# Patient Record
Sex: Female | Born: 1945 | Race: White | Hispanic: No | Marital: Single | Smoking: Never smoker
Health system: Southern US, Community
[De-identification: ages and names within clinical notes are randomized; demographics above are authoritative.]

## PROBLEM LIST (undated history)

## (undated) DIAGNOSIS — E7800 Pure hypercholesterolemia, unspecified: Secondary | ICD-10-CM

## (undated) DIAGNOSIS — E229 Hyperfunction of pituitary gland, unspecified: Secondary | ICD-10-CM

## (undated) DIAGNOSIS — R319 Hematuria, unspecified: Secondary | ICD-10-CM

## (undated) DIAGNOSIS — IMO0002 Reserved for concepts with insufficient information to code with codable children: Secondary | ICD-10-CM

## (undated) DIAGNOSIS — R87619 Unspecified abnormal cytological findings in specimens from cervix uteri: Secondary | ICD-10-CM

## (undated) DIAGNOSIS — R7989 Other specified abnormal findings of blood chemistry: Secondary | ICD-10-CM

## (undated) HISTORY — DX: Unspecified abnormal cytological findings in specimens from cervix uteri: R87.619

## (undated) HISTORY — PX: BREAST BIOPSY: SHX20

## (undated) HISTORY — PX: APPENDECTOMY: SHX54

## (undated) HISTORY — DX: Pure hypercholesterolemia, unspecified: E78.00

## (undated) HISTORY — PX: GYNECOLOGIC CRYOSURGERY: SHX857

## (undated) HISTORY — PX: CHOLECYSTECTOMY: SHX55

## (undated) HISTORY — DX: Hyperfunction of pituitary gland, unspecified: E22.9

## (undated) HISTORY — DX: Other specified abnormal findings of blood chemistry: R79.89

## (undated) HISTORY — DX: Reserved for concepts with insufficient information to code with codable children: IMO0002

## (undated) HISTORY — DX: Hematuria, unspecified: R31.9

## (undated) HISTORY — PX: STRABISMUS SURGERY: SHX218

---

## 1991-10-05 HISTORY — PX: TUBAL LIGATION: SHX77

## 1996-10-04 HISTORY — PX: VULVAR LESION REMOVAL: SHX5391

## 1999-07-06 ENCOUNTER — Other Ambulatory Visit: Admission: RE | Admit: 1999-07-06 | Discharge: 1999-07-06 | Payer: Self-pay | Admitting: Obstetrics and Gynecology

## 2000-08-22 ENCOUNTER — Other Ambulatory Visit: Admission: RE | Admit: 2000-08-22 | Discharge: 2000-08-22 | Payer: Self-pay | Admitting: Obstetrics and Gynecology

## 2001-07-25 ENCOUNTER — Other Ambulatory Visit: Admission: RE | Admit: 2001-07-25 | Discharge: 2001-07-25 | Payer: Self-pay | Admitting: Obstetrics and Gynecology

## 2001-08-22 ENCOUNTER — Encounter: Payer: Self-pay | Admitting: Obstetrics and Gynecology

## 2001-08-22 ENCOUNTER — Ambulatory Visit (HOSPITAL_COMMUNITY): Admission: RE | Admit: 2001-08-22 | Discharge: 2001-08-22 | Payer: Self-pay | Admitting: Obstetrics and Gynecology

## 2002-07-09 ENCOUNTER — Encounter (INDEPENDENT_AMBULATORY_CARE_PROVIDER_SITE_OTHER): Payer: Self-pay | Admitting: Specialist

## 2002-07-09 ENCOUNTER — Ambulatory Visit (HOSPITAL_COMMUNITY): Admission: RE | Admit: 2002-07-09 | Discharge: 2002-07-09 | Payer: Self-pay | Admitting: *Deleted

## 2002-07-31 ENCOUNTER — Other Ambulatory Visit: Admission: RE | Admit: 2002-07-31 | Discharge: 2002-07-31 | Payer: Self-pay | Admitting: Obstetrics and Gynecology

## 2002-08-28 ENCOUNTER — Ambulatory Visit (HOSPITAL_COMMUNITY): Admission: RE | Admit: 2002-08-28 | Discharge: 2002-08-28 | Payer: Self-pay | Admitting: Obstetrics and Gynecology

## 2002-08-28 ENCOUNTER — Encounter: Payer: Self-pay | Admitting: Obstetrics and Gynecology

## 2003-09-20 ENCOUNTER — Ambulatory Visit (HOSPITAL_COMMUNITY): Admission: RE | Admit: 2003-09-20 | Discharge: 2003-09-20 | Payer: Self-pay | Admitting: Obstetrics and Gynecology

## 2003-10-11 ENCOUNTER — Encounter: Admission: RE | Admit: 2003-10-11 | Discharge: 2003-10-11 | Payer: Self-pay | Admitting: Obstetrics and Gynecology

## 2003-11-05 ENCOUNTER — Other Ambulatory Visit: Admission: RE | Admit: 2003-11-05 | Discharge: 2003-11-05 | Payer: Self-pay | Admitting: Obstetrics and Gynecology

## 2004-12-04 ENCOUNTER — Encounter: Admission: RE | Admit: 2004-12-04 | Discharge: 2004-12-04 | Payer: Self-pay | Admitting: Obstetrics and Gynecology

## 2004-12-07 ENCOUNTER — Other Ambulatory Visit: Admission: RE | Admit: 2004-12-07 | Discharge: 2004-12-07 | Payer: Self-pay | Admitting: Obstetrics and Gynecology

## 2005-12-23 ENCOUNTER — Encounter: Admission: RE | Admit: 2005-12-23 | Discharge: 2005-12-23 | Payer: Self-pay | Admitting: Obstetrics and Gynecology

## 2006-02-02 ENCOUNTER — Other Ambulatory Visit: Admission: RE | Admit: 2006-02-02 | Discharge: 2006-02-02 | Payer: Self-pay | Admitting: Obstetrics and Gynecology

## 2007-01-11 ENCOUNTER — Encounter: Admission: RE | Admit: 2007-01-11 | Discharge: 2007-01-11 | Payer: Self-pay | Admitting: Obstetrics and Gynecology

## 2007-02-14 ENCOUNTER — Other Ambulatory Visit: Admission: RE | Admit: 2007-02-14 | Discharge: 2007-02-14 | Payer: Self-pay | Admitting: Obstetrics and Gynecology

## 2008-02-05 ENCOUNTER — Encounter: Admission: RE | Admit: 2008-02-05 | Discharge: 2008-02-05 | Payer: Self-pay | Admitting: Obstetrics and Gynecology

## 2008-02-23 ENCOUNTER — Other Ambulatory Visit: Admission: RE | Admit: 2008-02-23 | Discharge: 2008-02-23 | Payer: Self-pay | Admitting: Obstetrics and Gynecology

## 2009-02-11 ENCOUNTER — Encounter: Admission: RE | Admit: 2009-02-11 | Discharge: 2009-02-11 | Payer: Self-pay | Admitting: Obstetrics and Gynecology

## 2010-02-18 ENCOUNTER — Encounter: Admission: RE | Admit: 2010-02-18 | Discharge: 2010-02-18 | Payer: Self-pay | Admitting: Obstetrics and Gynecology

## 2010-02-24 ENCOUNTER — Encounter: Admission: RE | Admit: 2010-02-24 | Discharge: 2010-02-24 | Payer: Self-pay | Admitting: Obstetrics and Gynecology

## 2010-07-01 ENCOUNTER — Emergency Department (HOSPITAL_COMMUNITY): Admission: EM | Admit: 2010-07-01 | Discharge: 2010-07-01 | Payer: Self-pay | Admitting: Emergency Medicine

## 2010-10-04 HISTORY — PX: NASAL HEMORRHAGE CONTROL: SHX287

## 2010-12-17 LAB — POCT I-STAT, CHEM 8
Chloride: 104 mEq/L (ref 96–112)
Glucose, Bld: 110 mg/dL — ABNORMAL HIGH (ref 70–99)
HCT: 36 % (ref 36.0–46.0)
Hemoglobin: 12.2 g/dL (ref 12.0–15.0)
Potassium: 4.2 mEq/L (ref 3.5–5.1)

## 2011-02-11 ENCOUNTER — Other Ambulatory Visit: Payer: Self-pay | Admitting: Obstetrics and Gynecology

## 2011-02-11 DIAGNOSIS — Z1231 Encounter for screening mammogram for malignant neoplasm of breast: Secondary | ICD-10-CM

## 2011-02-23 ENCOUNTER — Ambulatory Visit: Payer: Self-pay

## 2011-02-24 ENCOUNTER — Ambulatory Visit: Payer: Self-pay

## 2011-03-09 ENCOUNTER — Ambulatory Visit: Payer: Self-pay

## 2011-03-16 ENCOUNTER — Ambulatory Visit
Admission: RE | Admit: 2011-03-16 | Discharge: 2011-03-16 | Disposition: A | Discharge status: Home / Self Care | Payer: Medicare Other | Source: Ambulatory Visit | Attending: Obstetrics and Gynecology | Admitting: Obstetrics and Gynecology

## 2011-03-16 DIAGNOSIS — Z1231 Encounter for screening mammogram for malignant neoplasm of breast: Secondary | ICD-10-CM

## 2011-03-17 ENCOUNTER — Other Ambulatory Visit: Payer: Self-pay | Admitting: Obstetrics and Gynecology

## 2011-03-17 DIAGNOSIS — R928 Other abnormal and inconclusive findings on diagnostic imaging of breast: Secondary | ICD-10-CM

## 2011-04-05 ENCOUNTER — Other Ambulatory Visit: Payer: Medicare Other

## 2011-04-12 ENCOUNTER — Ambulatory Visit
Admission: RE | Admit: 2011-04-12 | Discharge: 2011-04-12 | Disposition: A | Discharge status: Home / Self Care | Payer: Medicare Other | Source: Ambulatory Visit | Attending: Obstetrics and Gynecology | Admitting: Obstetrics and Gynecology

## 2011-04-12 DIAGNOSIS — R928 Other abnormal and inconclusive findings on diagnostic imaging of breast: Secondary | ICD-10-CM

## 2011-10-27 DIAGNOSIS — Z23 Encounter for immunization: Secondary | ICD-10-CM | POA: Diagnosis not present

## 2012-01-20 DIAGNOSIS — E7800 Pure hypercholesterolemia, unspecified: Secondary | ICD-10-CM | POA: Diagnosis not present

## 2012-02-04 DIAGNOSIS — D239 Other benign neoplasm of skin, unspecified: Secondary | ICD-10-CM | POA: Diagnosis not present

## 2012-02-04 DIAGNOSIS — L821 Other seborrheic keratosis: Secondary | ICD-10-CM | POA: Diagnosis not present

## 2012-02-04 DIAGNOSIS — L819 Disorder of pigmentation, unspecified: Secondary | ICD-10-CM | POA: Diagnosis not present

## 2012-02-04 DIAGNOSIS — D1801 Hemangioma of skin and subcutaneous tissue: Secondary | ICD-10-CM | POA: Diagnosis not present

## 2012-04-26 ENCOUNTER — Other Ambulatory Visit: Payer: Self-pay | Admitting: Obstetrics and Gynecology

## 2012-04-26 DIAGNOSIS — Z1231 Encounter for screening mammogram for malignant neoplasm of breast: Secondary | ICD-10-CM

## 2012-05-09 ENCOUNTER — Ambulatory Visit
Admission: RE | Admit: 2012-05-09 | Discharge: 2012-05-09 | Disposition: A | Discharge status: Home / Self Care | Payer: Medicare Other | Source: Ambulatory Visit | Attending: Obstetrics and Gynecology | Admitting: Obstetrics and Gynecology

## 2012-05-09 DIAGNOSIS — Z1231 Encounter for screening mammogram for malignant neoplasm of breast: Secondary | ICD-10-CM

## 2012-05-24 DIAGNOSIS — Z124 Encounter for screening for malignant neoplasm of cervix: Secondary | ICD-10-CM | POA: Diagnosis not present

## 2012-05-24 DIAGNOSIS — Z01419 Encounter for gynecological examination (general) (routine) without abnormal findings: Secondary | ICD-10-CM | POA: Diagnosis not present

## 2012-07-21 DIAGNOSIS — Z862 Personal history of diseases of the blood and blood-forming organs and certain disorders involving the immune mechanism: Secondary | ICD-10-CM | POA: Diagnosis not present

## 2012-07-21 DIAGNOSIS — Z23 Encounter for immunization: Secondary | ICD-10-CM | POA: Diagnosis not present

## 2012-07-21 DIAGNOSIS — M949 Disorder of cartilage, unspecified: Secondary | ICD-10-CM | POA: Diagnosis not present

## 2012-07-21 DIAGNOSIS — E559 Vitamin D deficiency, unspecified: Secondary | ICD-10-CM | POA: Diagnosis not present

## 2012-07-21 DIAGNOSIS — E782 Mixed hyperlipidemia: Secondary | ICD-10-CM | POA: Diagnosis not present

## 2012-07-21 DIAGNOSIS — M899 Disorder of bone, unspecified: Secondary | ICD-10-CM | POA: Diagnosis not present

## 2012-07-21 DIAGNOSIS — Z0000 Encounter for general adult medical examination without abnormal findings: Secondary | ICD-10-CM | POA: Diagnosis not present

## 2012-07-21 DIAGNOSIS — Z8639 Personal history of other endocrine, nutritional and metabolic disease: Secondary | ICD-10-CM | POA: Diagnosis not present

## 2012-07-24 DIAGNOSIS — M949 Disorder of cartilage, unspecified: Secondary | ICD-10-CM | POA: Diagnosis not present

## 2012-07-24 DIAGNOSIS — M899 Disorder of bone, unspecified: Secondary | ICD-10-CM | POA: Diagnosis not present

## 2012-09-08 DIAGNOSIS — H524 Presbyopia: Secondary | ICD-10-CM | POA: Diagnosis not present

## 2012-09-08 DIAGNOSIS — H5200 Hypermetropia, unspecified eye: Secondary | ICD-10-CM | POA: Diagnosis not present

## 2012-09-08 DIAGNOSIS — H53009 Unspecified amblyopia, unspecified eye: Secondary | ICD-10-CM | POA: Diagnosis not present

## 2012-09-08 DIAGNOSIS — H25019 Cortical age-related cataract, unspecified eye: Secondary | ICD-10-CM | POA: Diagnosis not present

## 2012-12-21 ENCOUNTER — Telehealth: Payer: Self-pay | Admitting: *Deleted

## 2012-12-21 NOTE — Telephone Encounter (Signed)
CHART IN CABINET FOR YOU TO SIGN AND DOCUMENT FOR PRIOR AUTH  FOR PROMETRIUM. WAS APPROVED LAST YEAR. NEED RE APPROVAL FOR ANOTHER YEAR. SEE DOCUMENTATION AND LETTER FROM LAST YEAR IN CHART WHERE STICKY NOTE IS. GIVE BACK TO ME AND I WILL FAX. SUE

## 2013-01-02 ENCOUNTER — Telehealth: Payer: Self-pay | Admitting: Obstetrics and Gynecology

## 2013-01-02 NOTE — Telephone Encounter (Signed)
Pt calling to check if pharmacy has sent request for a refill.

## 2013-01-02 NOTE — Telephone Encounter (Signed)
Spoke with pt. Regarding a Medicare denial for prometrium, pt. Says she's not having any problems with using  brand name and would like to continue. She is completely out and will pick up refill and pay out of pocket . T/C to [facility] regarding the denial letter in chart dated 12/23/2012, Coverage is available for The following drugs on the formulary   Progesterone 100mg  and 200mg . Reason for denial product is not an FDA-approved indication or a medically-accepted indication per CMS-recognized compendia. If you wish to appeal An address is in the chart. Please advise chart is in your cabinet  Thanks

## 2013-01-03 NOTE — Telephone Encounter (Signed)
It would be fine for her to use progesterone 100 mg po qhs - really no reason to insist on name brand, especially if it will save her $$.  Whichever she wants is fine, ok to refill through August 2014, when she is due for her AnEx.

## 2013-01-03 NOTE — Telephone Encounter (Signed)
01/03/13 LMTCB cm

## 2013-01-04 NOTE — Telephone Encounter (Signed)
Patient says she has enough rf's to last her until AEX in august; advised pt per Dr. Tresa Res that the brand name was not necessary, pt is agreeable.

## 2013-02-14 DIAGNOSIS — M2550 Pain in unspecified joint: Secondary | ICD-10-CM | POA: Diagnosis not present

## 2013-02-14 DIAGNOSIS — N951 Menopausal and female climacteric states: Secondary | ICD-10-CM | POA: Diagnosis not present

## 2013-02-14 DIAGNOSIS — J309 Allergic rhinitis, unspecified: Secondary | ICD-10-CM | POA: Diagnosis not present

## 2013-02-14 DIAGNOSIS — D699 Hemorrhagic condition, unspecified: Secondary | ICD-10-CM | POA: Diagnosis not present

## 2013-02-14 DIAGNOSIS — Z713 Dietary counseling and surveillance: Secondary | ICD-10-CM | POA: Diagnosis not present

## 2013-02-27 DIAGNOSIS — K5730 Diverticulosis of large intestine without perforation or abscess without bleeding: Secondary | ICD-10-CM | POA: Diagnosis not present

## 2013-02-27 DIAGNOSIS — K648 Other hemorrhoids: Secondary | ICD-10-CM | POA: Diagnosis not present

## 2013-02-27 DIAGNOSIS — Z86010 Personal history of colonic polyps: Secondary | ICD-10-CM | POA: Diagnosis not present

## 2013-02-27 DIAGNOSIS — Z09 Encounter for follow-up examination after completed treatment for conditions other than malignant neoplasm: Secondary | ICD-10-CM | POA: Diagnosis not present

## 2013-04-09 ENCOUNTER — Other Ambulatory Visit: Payer: Self-pay

## 2013-04-09 DIAGNOSIS — Z1231 Encounter for screening mammogram for malignant neoplasm of breast: Secondary | ICD-10-CM

## 2013-05-10 ENCOUNTER — Ambulatory Visit
Admission: RE | Admit: 2013-05-10 | Discharge: 2013-05-10 | Disposition: A | Discharge status: Home / Self Care | Payer: Medicare Other | Source: Ambulatory Visit

## 2013-05-10 ENCOUNTER — Ambulatory Visit: Payer: Medicare Other

## 2013-05-10 DIAGNOSIS — Z1231 Encounter for screening mammogram for malignant neoplasm of breast: Secondary | ICD-10-CM

## 2013-05-28 ENCOUNTER — Ambulatory Visit: Payer: Self-pay | Admitting: Obstetrics and Gynecology

## 2013-05-28 ENCOUNTER — Ambulatory Visit (INDEPENDENT_AMBULATORY_CARE_PROVIDER_SITE_OTHER): Payer: Medicare Other | Admitting: Obstetrics and Gynecology

## 2013-05-28 ENCOUNTER — Encounter: Payer: Self-pay | Admitting: Obstetrics and Gynecology

## 2013-05-28 VITALS — BP 108/68 | HR 84 | Resp 18 | Ht 64.0 in | Wt 133.0 lb

## 2013-05-28 DIAGNOSIS — Z124 Encounter for screening for malignant neoplasm of cervix: Secondary | ICD-10-CM

## 2013-05-28 DIAGNOSIS — Z01419 Encounter for gynecological examination (general) (routine) without abnormal findings: Secondary | ICD-10-CM | POA: Diagnosis not present

## 2013-05-28 MED ORDER — PROGESTERONE MICRONIZED 100 MG PO CAPS: 100.0000 mg | ORAL_CAPSULE | Freq: Every day | ORAL | Status: DC

## 2013-05-28 MED ORDER — ESTROGENS CONJUGATED 0.625 MG PO TABS: 0.6250 mg | ORAL_TABLET | Freq: Every day | ORAL | Status: DC

## 2013-05-28 NOTE — Patient Instructions (Signed)

## 2013-05-28 NOTE — Progress Notes (Signed)
67 y.o.   Single    Caucasian   female   G0P0000   here for annual exam.  No vag bleeding.    No LMP recorded. Patient is postmenopausal.          Sexually active: no  The current method of family planning is abstinence and post menopausal status.    Exercising: cardio, water aerobics, stretch 3-4 days a week Last mammogram:  05/2013 normal (3-D) Last pap smear: 04/27/11 neg History of abnormal pap: 1998 Smoking quit 45 years ago Alcohol: 1-2 glasses of wine or alcohol a day Last colonoscopy:02/2013 normal, repeat in 5 years Last Bone Density:  07/24/12 osteopenia Last tetanus shot: less than 10years Last cholesterol check: 07/2012 slightly elevated, controlled w/diet  Hgb:     pcp           Urine: pcp   Family History  Problem Relation Age of Onset  . Breast cancer Mother   . Cancer Mother     pancreatic cancer  . Cancer Father     colon cancer    There are no active problems to display for this patient.   Past Medical History  Diagnosis Date  . Abnormal Pap smear   . Hematuria   . Elevated prolactin level     Past Surgical History  Procedure Laterality Date  . Gynecologic cryosurgery    . Vulvar lesion removal  1998    biopsy  . Cholecystectomy      age 35  . Appendectomy    . Strabismus surgery      muscle correction  ou eyes  . Tubal ligation  1993    BTL  . Nasal hemorrhage control  2012    Allergies: Review of patient's allergies indicates no known allergies.  Current Outpatient Prescriptions  Medication Sig Dispense Refill  . Cholecalciferol (VITAMIN D) 2000 UNITS CAPS Take by mouth daily.      . Multiple Vitamin (MULTI-VITAMIN DAILY PO) Take by mouth daily.      Marland Kitchen PREMARIN 0.625 MG tablet       . progesterone (PROMETRIUM) 100 MG capsule        No current facility-administered medications for this visit.    ROS: Pertinent items are noted in HPI.  Social ZO:XWRUEA, no children, retired    Exam:    BP 108/68  Pulse 84  Resp 18  Ht 5\' 4"   (1.626 m)  Wt 133 lb (60.328 kg)  BMI 22.82 kg/m2Ht stable and wt down 5 pounds from last year   Wt Readings from Last 3 Encounters:  05/28/13 133 lb (60.328 kg)     Ht Readings from Last 3 Encounters:  05/28/13 5\' 4"  (1.626 m)    General appearance: alert, cooperative and appears stated age Head: Normocephalic, without obvious abnormality, atraumatic Neck: no adenopathy, supple, symmetrical, trachea midline and thyroid not enlarged, symmetric, no tenderness/mass/nodules Lungs: clear to auscultation bilaterally Breasts: Inspection negative, No nipple retraction or dimpling, No nipple discharge or bleeding, No axillary or supraclavicular adenopathy, Normal to palpation without dominant masses Heart: regular rate and rhythm Abdomen: soft, non-tender; bowel sounds normal; no masses,  no organomegaly Extremities: extremities normal, atraumatic, no cyanosis or edema Skin: Skin color, texture, turgor normal. No rashes or lesions Lymph nodes: Cervical, supraclavicular, and axillary nodes normal. No abnormal inguinal nodes palpated Neurologic: Grossly normal   Pelvic: External genitalia:  no lesions              Urethra:  normal appearing urethra  with no masses, tenderness or lesions              Bartholins and Skenes: normal                 Vagina: normal appearing vagina with normal color and discharge, no lesions              Cervix: normal appearance              Pap taken: no        Bimanual Exam:  Uterus:  uterus is normal size, shape, consistency and nontender                                      Adnexa: normal adnexa in size, nontender and no masses                                      Rectovaginal: Confirms                                      Anus:  normal sphincter tone, no lesions  A: normal menopausal exam, on HRT     FH of breast cancer in mother and sister - pt accepts referral for genetic testing     P:     mammogram counseled on breast self exam, mammography  screening, adequate intake of calcium and vitamin D, diet and exercise return annually or prn   Refill HRT Refer for genetic counselling   An After Visit Summary was printed and given to the patient.

## 2013-05-31 ENCOUNTER — Telehealth: Payer: Self-pay | Admitting: Genetic Counselor

## 2013-05-31 NOTE — Telephone Encounter (Signed)
S/W PT IN RE TO GENETIC APPT 09/25 @ 1 W/KAREN POWELL. WELCOME PACKET MAILED.

## 2013-06-06 DIAGNOSIS — L089 Local infection of the skin and subcutaneous tissue, unspecified: Secondary | ICD-10-CM | POA: Diagnosis not present

## 2013-06-26 ENCOUNTER — Telehealth: Payer: Self-pay | Admitting: Obstetrics and Gynecology

## 2013-06-26 ENCOUNTER — Telehealth: Payer: Self-pay | Admitting: Genetic Counselor

## 2013-06-26 NOTE — Telephone Encounter (Signed)
AEX was 05/28/13  #30/12 rf's was sent to patient's pharmacy [facility] Called pharmacy they do have rx told them to go ahead and fill it. Called patient she is aware that rx was sent and is being filled.

## 2013-06-26 NOTE — Telephone Encounter (Signed)
Pt appt. With genetics is 09/10/13@2 :00. Pt is aware

## 2013-06-26 NOTE — Telephone Encounter (Signed)
Pt is calling to get her Rx for Progesterone [facility] to [facility] [address]. Their number is [phone].

## 2013-06-28 ENCOUNTER — Other Ambulatory Visit: Payer: Medicare Other | Admitting: Lab

## 2013-06-28 ENCOUNTER — Encounter: Payer: Medicare Other | Admitting: Genetic Counselor

## 2013-08-06 DIAGNOSIS — Z23 Encounter for immunization: Secondary | ICD-10-CM | POA: Diagnosis not present

## 2013-08-17 DIAGNOSIS — L259 Unspecified contact dermatitis, unspecified cause: Secondary | ICD-10-CM | POA: Diagnosis not present

## 2013-08-17 DIAGNOSIS — L821 Other seborrheic keratosis: Secondary | ICD-10-CM | POA: Diagnosis not present

## 2013-08-17 DIAGNOSIS — D485 Neoplasm of uncertain behavior of skin: Secondary | ICD-10-CM | POA: Diagnosis not present

## 2013-08-17 DIAGNOSIS — L03019 Cellulitis of unspecified finger: Secondary | ICD-10-CM | POA: Diagnosis not present

## 2013-08-17 DIAGNOSIS — L819 Disorder of pigmentation, unspecified: Secondary | ICD-10-CM | POA: Diagnosis not present

## 2013-09-07 ENCOUNTER — Telehealth: Payer: Self-pay | Admitting: Genetic Counselor

## 2013-09-07 NOTE — Telephone Encounter (Signed)
Maria Vaughn called and had questions on whether Medicare will cover testing.  I called her cell phone number and left a message that Medicare will cover genetic testing if she meets the medical criteria put in place by medicare.  At her appointment on MOnday we will take a personal and family history to determine whether Medicare will cover testing.  If she has further questions, she can call back.

## 2013-09-10 ENCOUNTER — Encounter: Payer: Self-pay | Admitting: Genetic Counselor

## 2013-09-10 ENCOUNTER — Encounter (INDEPENDENT_AMBULATORY_CARE_PROVIDER_SITE_OTHER): Payer: Self-pay

## 2013-09-10 ENCOUNTER — Other Ambulatory Visit: Payer: Medicare Other | Admitting: Lab

## 2013-09-10 ENCOUNTER — Ambulatory Visit (HOSPITAL_BASED_OUTPATIENT_CLINIC_OR_DEPARTMENT_OTHER): Payer: Medicare Other | Admitting: Genetic Counselor

## 2013-09-10 DIAGNOSIS — IMO0002 Reserved for concepts with insufficient information to code with codable children: Secondary | ICD-10-CM

## 2013-09-10 DIAGNOSIS — Z800 Family history of malignant neoplasm of digestive organs: Secondary | ICD-10-CM | POA: Diagnosis not present

## 2013-09-10 DIAGNOSIS — Z803 Family history of malignant neoplasm of breast: Secondary | ICD-10-CM | POA: Diagnosis not present

## 2013-09-10 NOTE — Progress Notes (Signed)
Dr.  Aram Beecham Romine requested a consultation for genetic counseling and risk assessment for Maria Vaughn, a 67 y.o. female, for discussion of her family history of breast, colon and pancreatic cancer.  She presents to clinic today to discuss the possibility of a genetic predisposition to cancer, and to further clarify her risks, as well as her family members' risks for cancer.   HISTORY OF PRESENT ILLNESS: Maria Vaughn is a 67 y.o. female with no personal history of cancer.  She is on an increased screening protocol of every 5 years based on a polyp count of 4-5 lifetime polyps.  She had one breast biopsy that was negative.   Past Medical History  Diagnosis Date  . Abnormal Pap smear   . Hematuria   . Elevated prolactin level     Past Surgical History  Procedure Laterality Date  . Gynecologic cryosurgery    . Vulvar lesion removal  1998    biopsy  . Cholecystectomy      age 14  . Appendectomy    . Strabismus surgery      muscle correction  ou eyes  . Tubal ligation  1993    BTL  . Nasal hemorrhage control  2012    History   Social History  . Marital Status: Single    Spouse Name: N/A    Number of Children: 0  . Years of Education: N/A   Social History Main Topics  . Smoking status: Former Smoker    Quit date: 05/28/1968  . Smokeless tobacco: Never Used  . Alcohol Use: 3.5 - 7 oz/week    7-14 drink(s) per week     Comment: 1-2 glasses of wine or alcohol a day  . Drug Use: No  . Sexual Activity: No   Other Topics Concern  . None   Social History Narrative  . None    REPRODUCTIVE HISTORY AND PERSONAL RISK ASSESSMENT FACTORS: Menarche was at age 26.   postmenopausal Uterus Intact: yes Ovaries Intact: yes G0P0A0, first live birth at age N/A  She has not previously undergone treatment for infertility.   Oral Contraceptive use: 15 years   She has used HRT in the past.    FAMILY HISTORY:  We obtained a detailed, 4-generation family history.  Significant  diagnoses are listed below: Family History  Problem Relation Age of Onset  . Breast cancer Mother 38    recurrance at 33  . Pancreatic cancer Mother 50  . Colon cancer Father 58  . Breast cancer Sister 57    ER+ breast cancer, IDC/DCIS    Patient's maternal ancestors are of [place], [place] and [place] descent, and paternal ancestors are of [place], [place] and [place] descent. There is no reported Ashkenazi Jewish ancestry. There is no known consanguinity.  GENETIC COUNSELING ASSESSMENT: [name] is a 67 y.o. female with a family history of breast, pancreatic and colon cancer which somewhat suggestive of a hereditary cancer syndrome vs. Familial disease and predisposition to cancer. We, therefore, discussed and recommended the following at today's visit.   DISCUSSION: We reviewed the characteristics, features and inheritance patterns of hereditary cancer syndromes. We also discussed genetic testing, including the appropriate family members to test, the process of testing, insurance coverage and turn-around-time for results.   In order to estimate her chance of having a BRCA mutation, we used statistical models (Tyrer Cusik, Penn II and Myriad Risk Calculator) and laboratory data that take into account her personal medical history, family  history and ancestry.  Because each model is different, there can be a lot of variability in the risks they give.  Therefore, these numbers must be considered a rough range and not a precise risk of having a BRCA mutation.  These models estimate that she has approximately a less than 1%-4% chance of having a mutation. Based on this assessment of her family and personal history, genetic testing is no recommended.  Based on the patient's personal and family history, statistical models Dondra Spry Model and Stevan Born cusik)  and literature data were used to estimate her risk of developing breast cancer. These estimate her lifetime risk of developing Breast cancer  to be approximately 20.9% to 39.9%. This estimation does not take into account any genetic testing results.  The patient's lifetime breast cancer risk is a preliminary estimate based on available information using one of several models endorsed by the American Cancer Society (ACS). The ACS recommends consideration of breast MRI screening as an adjunct to mammography for patients at high risk (defined as 20% or greater lifetime risk). A more detailed breast cancer risk assessment can be considered, if clinically indicated.   PLAN: After considering the risks, benefits, and limitations, Myrian E Huizinga declined testing based on not meeting Medicare guidelines. We discussed that her sister may be able to pursue testing and if she were positive we could test Cyanne at a much lower cost.  We encouraged Joselynne E Cajamarca to remain in contact with cancer genetics annually so that we can continuously update the family history and inform her of any changes in cancer genetics and testing that may be of benefit for her family. Timya E Pflaum's questions were answered to her satisfaction today. Our contact information was provided should additional questions or concerns arise.  The patient was seen for a total of 60 minutes, greater than 50% of which was spent face-to-face counseling.  This note will also be sent to the referring provider via the electronic medical record. The patient will be supplied with a summary of this genetic counseling discussion as well as educational information on the discussed hereditary cancer syndromes following the conclusion of their visit.   Patient was discussed with Dr. Drue Second.   _______________________________________________________________________ For Office Staff:  Number of people involved in session: 1 Was an Intern/ student involved with case: no

## 2013-09-11 DIAGNOSIS — Z0000 Encounter for general adult medical examination without abnormal findings: Secondary | ICD-10-CM | POA: Diagnosis not present

## 2013-09-11 DIAGNOSIS — M899 Disorder of bone, unspecified: Secondary | ICD-10-CM | POA: Diagnosis not present

## 2013-09-11 DIAGNOSIS — Z136 Encounter for screening for cardiovascular disorders: Secondary | ICD-10-CM | POA: Diagnosis not present

## 2013-09-11 DIAGNOSIS — L290 Pruritus ani: Secondary | ICD-10-CM | POA: Diagnosis not present

## 2013-09-12 DIAGNOSIS — H5010 Unspecified exotropia: Secondary | ICD-10-CM | POA: Diagnosis not present

## 2013-09-12 DIAGNOSIS — H53009 Unspecified amblyopia, unspecified eye: Secondary | ICD-10-CM | POA: Diagnosis not present

## 2013-11-09 ENCOUNTER — Telehealth: Payer: Self-pay | Admitting: Obstetrics and Gynecology

## 2013-11-09 NOTE — Telephone Encounter (Signed)
Patient states she has been working with Arbie Cookey on a letter she received from [place] about a Premarin RX that needs prior-authorization. No open phone note, routing to triage.

## 2013-11-14 NOTE — Telephone Encounter (Signed)
Left a voice message needing to talk about her Prior Approval for Premarin.

## 2013-11-15 NOTE — Telephone Encounter (Signed)
Spoke with patient. She has faxed forms from [place].   [employer] contact number [phone] ID Number: [id number]  Patient has transferred rx to [facility].   Prior Authorization initiated order #[id number]. Should have determination in 72 hours with response.

## 2013-11-21 NOTE — Telephone Encounter (Signed)
PT calling to talk with Janett Billow

## 2013-11-21 NOTE — Telephone Encounter (Signed)
Spoke with patient. Advised that initial request (phone questionnaire) was denied but that I had faxed form with appeal request to [phone]. Will wait for results.  Patient agreeable to plan.

## 2013-11-21 NOTE — Telephone Encounter (Signed)
Message left to return call to Quron Ruddy at [phone].    

## 2013-11-27 NOTE — Telephone Encounter (Signed)
Spoke with [employer]. Test claim went through as approved for patient. 38.05/30 days of treatment.  Patient notified. Very appreciative and will discuss continuance with Dr. Quincy Simmonds at [place].  Will close encounter.  Routing to provider for final review. Patient agreeable to disposition. Will close encounter

## 2014-04-16 ENCOUNTER — Other Ambulatory Visit: Payer: Self-pay

## 2014-04-16 DIAGNOSIS — Z1231 Encounter for screening mammogram for malignant neoplasm of breast: Secondary | ICD-10-CM

## 2014-05-29 ENCOUNTER — Ambulatory Visit: Payer: Medicare Other | Admitting: Obstetrics and Gynecology

## 2014-05-31 ENCOUNTER — Telehealth: Payer: Self-pay | Admitting: Obstetrics and Gynecology

## 2014-05-31 NOTE — Telephone Encounter (Signed)
Confirming pts appt °

## 2014-06-05 NOTE — Telephone Encounter (Signed)
PT CONFIRM APPT/RD

## 2014-06-06 ENCOUNTER — Encounter: Payer: Self-pay | Admitting: Obstetrics and Gynecology

## 2014-06-06 ENCOUNTER — Ambulatory Visit (INDEPENDENT_AMBULATORY_CARE_PROVIDER_SITE_OTHER): Payer: Medicare Other | Admitting: Obstetrics and Gynecology

## 2014-06-06 VITALS — BP 110/74 | HR 80 | Resp 20 | Ht 64.0 in | Wt 135.4 lb

## 2014-06-06 DIAGNOSIS — Z124 Encounter for screening for malignant neoplasm of cervix: Secondary | ICD-10-CM

## 2014-06-06 DIAGNOSIS — Z01419 Encounter for gynecological examination (general) (routine) without abnormal findings: Secondary | ICD-10-CM

## 2014-06-06 MED ORDER — ESTROGENS CONJUGATED 0.3 MG PO TABS: 0.3000 mg | ORAL_TABLET | Freq: Every day | ORAL | Status: DC

## 2014-06-06 MED ORDER — PROGESTERONE MICRONIZED 100 MG PO CAPS: 100.0000 mg | ORAL_CAPSULE | Freq: Every day | ORAL | Status: DC

## 2014-06-06 NOTE — Progress Notes (Addendum)
Patient ID: Maria Vaughn, female   DOB: [birth year], 68 y.o.   MRN: [id number] GYNECOLOGY VISIT  PCP:  Heloise Ochoa, MD Sadie [facility]er at [place])  Referring provider:   HPI: 68 y.o.   Single  Caucasian  female   G0P0000 with Patient's last menstrual period was 10/04/1996.   here for   AEX. Patient is on HRT. Taking it since age 47.  Started it for hot flashes.   Mother and sister with breast cancer both in their 32s.  Back from one month trip to [place].    Hgb:    PCP Urine:  PCP  GYNECOLOGIC HISTORY: Patient's last menstrual period was 10/04/1996. Sexually active:  no Partner preference: no Menopausal hormone therapy:  DES exposure:   no Sexually transmitted diseases:  no  GYN procedures and prior surgeries:  Cryotherapy to cervix in her 40's, vulvar bx-benign 1998, Tubal ligation  Last mammogram: 05-10-13 extremely dense;wnl:[facility].                Last pap and high risk HPV testing:   04-27-11 wnl, no HR HPV. History of abnormal pap smear:  1998 cryotherapy to cervix.   OB History   Grav Para Term Preterm Abortions TAB SAB Ect Mult Living   0 0 0 0 0 0 0 0 0 0        LIFESTYLE: Exercise:   Cardio and flexibility           OTHER HEALTH MAINTENANCE: Tetanus/TDap:  Within past 10 years HPV:                  n/a Influenza:          07/2013   Bone density:    07/24/12:Osteopenia - T score left hip - 2.1, T score of right hip - 1.7, t score spine -1.3. Colonoscopy:   2013 normal with Dr. Pamalee Ley[facility].  Next colonoscopy due 2018 due to family history and personal history of colon polyps.  Cholesterol check:  09/2013 borderline  Family History  Problem Relation Age of Onset  . Breast cancer Mother 75    recurrance at 65  . Pancreatic cancer Mother 7  . Colon cancer Father 81  . Breast cancer Sister 93    ER+ breast cancer, IDC/DCIS  . Lupus Sister     There are no active problems to display for this patient.  Past Medical History  Diagnosis Date  .  Abnormal Pap smear   . Hematuria   . Elevated prolactin level     Past Surgical History  Procedure Laterality Date  . Gynecologic cryosurgery    . Vulvar lesion removal  1998    biopsy  . Cholecystectomy      age 50  . Appendectomy    . Strabismus surgery      muscle correction  ou eyes  . Tubal ligation  1993    BTL  . Nasal hemorrhage control  2012    ALLERGIES: Review of patient's allergies indicates no known allergies.  Current Outpatient Prescriptions  Medication Sig Dispense Refill  . estrogens, conjugated, (PREMARIN) 0.625 MG tablet Take 1 tablet (0.625 mg total) by mouth daily.  30 tablet  12  . Multiple Vitamin (MULTI-VITAMIN DAILY PO) Take by mouth daily.      . progesterone (PROMETRIUM) 100 MG capsule Take 1 capsule (100 mg total) by mouth daily.  30 capsule  12  . Cholecalciferol (VITAMIN D) 2000 UNITS CAPS Take by mouth daily.  No current facility-administered medications for this visit.     ROS:  Pertinent items are noted in HPI.  History   Social History  . Marital Status: Single    Spouse Name: N/A    Number of Children: 0  . Years of Education: N/A   Occupational History  . Not on file.   Social History Main Topics  . Smoking status: Former Smoker    Quit date: 05/28/1968  . Smokeless tobacco: Never Used  . Alcohol Use: 3.5 - 7.0 oz/week    7-14 drink(s) per week     Comment: 1-2 glasses of wine or alcohol a day  . Drug Use: No  . Sexual Activity: Not Currently    Partners: Male    Birth Control/ Protection: Post-menopausal     Comment: Tubal   Other Topics Concern  . Not on file   Social History Narrative  . No narrative on file    PHYSICAL EXAMINATION:    BP 110/74  Pulse 80  Resp 20  Ht 5\' 4"  (1.626 m)  Wt 135 lb 6.4 oz (61.417 kg)  BMI 23.23 kg/m2  LMP 10/04/1996   Wt Readings from Last 3 Encounters:  06/06/14 135 lb 6.4 oz (61.417 kg)  05/28/13 133 lb (60.328 kg)     Ht Readings from Last 3 Encounters:  06/06/14  5\' 4"  (1.626 m)  05/28/13 5\' 4"  (1.626 m)    General appearance: alert, cooperative and appears stated age Head: Normocephalic, without obvious abnormality, atraumatic Neck: no adenopathy, supple, symmetrical, trachea midline and thyroid not enlarged, symmetric, no tenderness/mass/nodules Lungs: clear to auscultation bilaterally Breasts: Inspection negative, No nipple retraction or dimpling, No nipple discharge or bleeding, No axillary or supraclavicular adenopathy, Normal to palpation without dominant masses Heart: regular rate and rhythm Abdomen: soft, non-tender; no masses,  no organomegaly Extremities: extremities normal, atraumatic, no cyanosis or edema Skin: Skin color, texture, turgor normal. No rashes or lesions Lymph nodes: Cervical, supraclavicular, and axillary nodes normal. No abnormal inguinal nodes palpated Neurologic: Grossly normal  Pelvic: External genitalia:  no lesions              Urethra:  normal appearing urethra with no masses, tenderness or lesions              Bartholins and Skenes: normal                 Vagina: normal appearing vagina with normal color and discharge, no lesions              Cervix: normal appearance              Pap and high risk HPV testing done: No.        Bimanual Exam:  Uterus:  uterus is normal size, shape, consistency and nontender                                      Adnexa: normal adnexa in size, nontender and no masses                                      Rectovaginal:  Yes.  Confirms above.                                      Anus:  normal sphincter tone, no lesions  ASSESSMENT  Normal gynecologic exam. HRT patient.  Family history of breast cancer.  Osteopenia.   PLAN  Mammogram recommended yearly starting at age 76.  Has appointment scheduled. Pap smear and high risk HPV testing as above. Counseled on self breast exam, Calcium and vitamin D intake, exercise. I discussed risks and  benefits of HRT.  Patient chooses to wean off.  Will reduce Premarin to 0.3 mg and continue Prometrium 100 mg for 3 more months and then stop. Bone density next year.  See lab orders: No. Return annually or prn   An After Visit Summary was printed and given to the patient.

## 2014-06-06 NOTE — Patient Instructions (Signed)

## 2014-06-11 ENCOUNTER — Ambulatory Visit
Admission: RE | Admit: 2014-06-11 | Discharge: 2014-06-11 | Disposition: A | Discharge status: Home / Self Care | Payer: Medicare Other | Source: Ambulatory Visit

## 2014-06-11 DIAGNOSIS — Z1231 Encounter for screening mammogram for malignant neoplasm of breast: Secondary | ICD-10-CM | POA: Diagnosis not present

## 2014-07-19 ENCOUNTER — Other Ambulatory Visit: Payer: Self-pay

## 2014-08-09 ENCOUNTER — Telehealth: Payer: Self-pay | Admitting: Obstetrics and Gynecology

## 2014-08-09 NOTE — Telephone Encounter (Signed)
Pt has a question regarding a bill.

## 2014-08-10 DIAGNOSIS — Z23 Encounter for immunization: Secondary | ICD-10-CM | POA: Diagnosis not present

## 2014-08-13 NOTE — Telephone Encounter (Signed)
Spoke to patient and advised. Will close the encounter

## 2014-09-10 DIAGNOSIS — L230 Allergic contact dermatitis due to metals: Secondary | ICD-10-CM | POA: Diagnosis not present

## 2014-09-10 DIAGNOSIS — L578 Other skin changes due to chronic exposure to nonionizing radiation: Secondary | ICD-10-CM | POA: Diagnosis not present

## 2014-09-10 DIAGNOSIS — D225 Melanocytic nevi of trunk: Secondary | ICD-10-CM | POA: Diagnosis not present

## 2014-09-13 DIAGNOSIS — H524 Presbyopia: Secondary | ICD-10-CM | POA: Diagnosis not present

## 2014-09-13 DIAGNOSIS — H2513 Age-related nuclear cataract, bilateral: Secondary | ICD-10-CM | POA: Diagnosis not present

## 2014-09-13 DIAGNOSIS — H53002 Unspecified amblyopia, left eye: Secondary | ICD-10-CM | POA: Diagnosis not present

## 2014-09-13 DIAGNOSIS — H5203 Hypermetropia, bilateral: Secondary | ICD-10-CM | POA: Diagnosis not present

## 2014-09-17 DIAGNOSIS — Z23 Encounter for immunization: Secondary | ICD-10-CM | POA: Diagnosis not present

## 2014-09-17 DIAGNOSIS — Z1389 Encounter for screening for other disorder: Secondary | ICD-10-CM | POA: Diagnosis not present

## 2014-09-17 DIAGNOSIS — Z0000 Encounter for general adult medical examination without abnormal findings: Secondary | ICD-10-CM | POA: Diagnosis not present

## 2014-09-17 DIAGNOSIS — M8580 Other specified disorders of bone density and structure, unspecified site: Secondary | ICD-10-CM | POA: Diagnosis not present

## 2014-10-08 ENCOUNTER — Telehealth: Payer: Self-pay | Admitting: Obstetrics and Gynecology

## 2014-10-08 NOTE — Telephone Encounter (Signed)
Spoke with patient. Patient states that she was weaning off HRT and came completely off the middle of December 2015. "I really thought I was going to do really well with this but the hot flashes and sweating are bad. I never knew I could sweat so much." Patient would like to know if there is anything she can try to reduce symptoms. Advised patient can try OTC black cohosh, St John's Wort, and increasing soy in diet. Patient is agreeable. Advised if OTC does not provide relief or symptoms worsen to give [place] a call. "Can I expect to gain weight because I came off my hormones?" Advised symptoms vary patient to patient but more commonly patient's experience hot flashes and night sweats. Patient is agreeable.  Routing to provider for final review. Patient agreeable to disposition. Will close encounter

## 2014-10-08 NOTE — Telephone Encounter (Signed)
Pt wanting to speak with nurse about going off her hormone medication and what she can do about hot flashes.

## 2015-02-04 DIAGNOSIS — L03011 Cellulitis of right finger: Secondary | ICD-10-CM | POA: Diagnosis not present

## 2015-02-04 DIAGNOSIS — L03012 Cellulitis of left finger: Secondary | ICD-10-CM | POA: Diagnosis not present

## 2015-03-06 DIAGNOSIS — M859 Disorder of bone density and structure, unspecified: Secondary | ICD-10-CM | POA: Diagnosis not present

## 2015-03-06 DIAGNOSIS — M8589 Other specified disorders of bone density and structure, multiple sites: Secondary | ICD-10-CM | POA: Diagnosis not present

## 2015-03-31 ENCOUNTER — Other Ambulatory Visit: Payer: Self-pay

## 2015-06-12 ENCOUNTER — Ambulatory Visit: Payer: Medicare Other | Admitting: Obstetrics and Gynecology

## 2015-06-26 ENCOUNTER — Other Ambulatory Visit: Payer: Self-pay

## 2015-06-26 DIAGNOSIS — Z1231 Encounter for screening mammogram for malignant neoplasm of breast: Secondary | ICD-10-CM

## 2015-06-27 ENCOUNTER — Ambulatory Visit
Admission: RE | Admit: 2015-06-27 | Discharge: 2015-06-27 | Disposition: A | Discharge status: Home / Self Care | Payer: Medicare Other | Source: Ambulatory Visit

## 2015-06-27 DIAGNOSIS — Z1231 Encounter for screening mammogram for malignant neoplasm of breast: Secondary | ICD-10-CM | POA: Diagnosis not present

## 2015-07-04 ENCOUNTER — Encounter: Payer: Self-pay | Admitting: Obstetrics and Gynecology

## 2015-07-04 ENCOUNTER — Ambulatory Visit (INDEPENDENT_AMBULATORY_CARE_PROVIDER_SITE_OTHER): Payer: Medicare Other | Admitting: Obstetrics and Gynecology

## 2015-07-04 VITALS — BP 118/80 | HR 76 | Resp 16 | Ht 64.25 in | Wt 138.0 lb

## 2015-07-04 DIAGNOSIS — Z803 Family history of malignant neoplasm of breast: Secondary | ICD-10-CM

## 2015-07-04 DIAGNOSIS — Z124 Encounter for screening for malignant neoplasm of cervix: Secondary | ICD-10-CM

## 2015-07-04 DIAGNOSIS — N952 Postmenopausal atrophic vaginitis: Secondary | ICD-10-CM | POA: Diagnosis not present

## 2015-07-04 DIAGNOSIS — Z01419 Encounter for gynecological examination (general) (routine) without abnormal findings: Secondary | ICD-10-CM

## 2015-07-04 DIAGNOSIS — Z1151 Encounter for screening for human papillomavirus (HPV): Secondary | ICD-10-CM | POA: Diagnosis not present

## 2015-07-04 MED ORDER — ESTRADIOL 10 MCG VA TABS: 1.0000 | ORAL_TABLET | VAGINAL | Status: DC

## 2015-07-04 NOTE — Progress Notes (Signed)
69 y.o. G0P0000 Single Caucasian female here for annual exam.    Is off HRT now. Some hot flashes.   Having left lower extremity buttock discomfort for 6 months.  Had a bone density with PCP.  Told to start vit D.   Sister not well and is living in [place].   Patient traveled to [place] and [place] in the last year.   PCP: Ibethal Shamleffer    Patient's last menstrual period was 10/04/1996.          Sexually active: No.  The current method of family planning is post menopausal status.    Exercising: Yes.    cardio, weights Smoker:  no  Health Maintenance: Pap:  04/2011 Negative History of abnormal Pap:  Yes, 1998 Cryotherapy to cervix  MMG:  06/30/15 BIRADS1:neg Colonoscopy:  02/27/13 Polyps. Due 2018  BMD:   2013   Result  Osteopenia.  Did bone density this year through PCP.  TDaP:  UTD w/ PCP Screening Labs:  Hb today: PCP, Urine today: PCP   reports that she quit smoking about 47 years ago. She has never used smokeless tobacco. She reports that she drinks about 3.5 - 7.0 oz of alcohol per week. She reports that she does not use illicit drugs.  Past Medical History  Diagnosis Date  . Abnormal Pap smear   . Hematuria   . Elevated prolactin level     Past Surgical History  Procedure Laterality Date  . Gynecologic cryosurgery    . Vulvar lesion removal  1998    biopsy  . Cholecystectomy      age 27  . Appendectomy    . Strabismus surgery      muscle correction  ou eyes  . Tubal ligation  1993    BTL  . Nasal hemorrhage control  2012    No current outpatient prescriptions on file.   No current facility-administered medications for this visit.    Family History  Problem Relation Age of Onset  . Breast cancer Mother 75    recurrance at 83  . Pancreatic cancer Mother 88  . Colon cancer Father 72  . Breast cancer Sister 69    ER+ breast cancer, IDC/DCIS  . Lupus Sister     ROS:  Pertinent items are noted in HPI.  Otherwise, a comprehensive ROS was  negative.  Exam:   BP 118/80 mmHg  Pulse 76  Resp 16  Ht 5' 4.25" (1.632 m)  Wt 138 lb (62.596 kg)  BMI 23.50 kg/m2  LMP 10/04/1996    General appearance: alert, cooperative and appears stated age Head: Normocephalic, without obvious abnormality, atraumatic Neck: no adenopathy, supple, symmetrical, trachea midline and thyroid normal to inspection and palpation Lungs: clear to auscultation bilaterally Breasts: normal appearance, no masses or tenderness, Inspection negative, No nipple retraction or dimpling, No nipple discharge or bleeding, No axillary or supraclavicular adenopathy Heart: regular rate and rhythm Abdomen: soft, non-tender; bowel sounds normal; no masses,  no organomegaly Extremities: extremities normal, atraumatic, no cyanosis or edema Skin: Skin color, texture, turgor normal. No rashes or lesions Lymph nodes: Cervical, supraclavicular, and axillary nodes normal. No abnormal inguinal nodes palpated Neurologic: Grossly normal  Pelvic: External genitalia:  no lesions              Urethra:  normal appearing urethra with no masses, tenderness or lesions              Bartholins and Skenes: normal                   Vagina:  Significant atrophy and bleeding with speculum and pap. No lesions noted.              Cervix: friable.              Pap taken: Yes.   Bimanual Exam:  Uterus:  normal size, contour, position, consistency, mobility, non-tender              Adnexa: normal adnexa and no mass, fullness, tenderness              Rectovaginal: Yes.  .  Confirms.              Anus:  normal sphincter tone, no lesions  Chaperone was present for exam.  Assessment:   Well woman visit with normal exam. Hx cryo to cervix.  Vaginal atrophy.  Osteopenia.  FH breast cancer - mother and sister.  Patient declined BRCA testing after having genetic counseling.  Increased lifetime risk of breast cancer - 21- 40%  Plan: Yearly mammogram recommended after age 68.  Discussed breast MRI.   Will do next year.  Unable to coordinate this year due to traveling.  Recommended self breast exam.  Pap and HR HPV as above. Discussed Calcium, Vitamin D, regular exercise program including cardiovascular and weight bearing exercise. Labs performed.  No..   See orders. Refills given on medications.  Yes.  .  See orders.  Vagifem.  Instructed in use.  Discussed risks of DVT, PE, MI, stroke and breast cancer.  Follow up annually and prn.     After visit summary provided.

## 2015-07-04 NOTE — Patient Instructions (Signed)

## 2015-07-08 LAB — IPS PAP TEST WITH HPV

## 2015-08-14 DIAGNOSIS — Z23 Encounter for immunization: Secondary | ICD-10-CM | POA: Diagnosis not present

## 2015-09-18 DIAGNOSIS — D7589 Other specified diseases of blood and blood-forming organs: Secondary | ICD-10-CM | POA: Diagnosis not present

## 2015-09-19 DIAGNOSIS — Z1389 Encounter for screening for other disorder: Secondary | ICD-10-CM | POA: Diagnosis not present

## 2015-09-19 DIAGNOSIS — D7589 Other specified diseases of blood and blood-forming organs: Secondary | ICD-10-CM | POA: Diagnosis not present

## 2015-09-19 DIAGNOSIS — H2513 Age-related nuclear cataract, bilateral: Secondary | ICD-10-CM | POA: Diagnosis not present

## 2015-09-19 DIAGNOSIS — H25013 Cortical age-related cataract, bilateral: Secondary | ICD-10-CM | POA: Diagnosis not present

## 2015-09-19 DIAGNOSIS — E785 Hyperlipidemia, unspecified: Secondary | ICD-10-CM | POA: Diagnosis not present

## 2015-09-19 DIAGNOSIS — Z0000 Encounter for general adult medical examination without abnormal findings: Secondary | ICD-10-CM | POA: Diagnosis not present

## 2015-09-19 DIAGNOSIS — Z0100 Encounter for examination of eyes and vision without abnormal findings: Secondary | ICD-10-CM | POA: Diagnosis not present

## 2015-09-19 DIAGNOSIS — R05 Cough: Secondary | ICD-10-CM | POA: Diagnosis not present

## 2015-09-24 ENCOUNTER — Ambulatory Visit: Payer: Medicare Other | Admitting: Obstetrics and Gynecology

## 2015-10-14 DIAGNOSIS — L578 Other skin changes due to chronic exposure to nonionizing radiation: Secondary | ICD-10-CM | POA: Diagnosis not present

## 2015-10-14 DIAGNOSIS — D229 Melanocytic nevi, unspecified: Secondary | ICD-10-CM | POA: Diagnosis not present

## 2015-10-14 DIAGNOSIS — L918 Other hypertrophic disorders of the skin: Secondary | ICD-10-CM | POA: Diagnosis not present

## 2016-02-18 DIAGNOSIS — K130 Diseases of lips: Secondary | ICD-10-CM | POA: Diagnosis not present

## 2016-07-16 ENCOUNTER — Ambulatory Visit: Payer: Medicare Other | Admitting: Obstetrics and Gynecology

## 2016-08-11 ENCOUNTER — Other Ambulatory Visit: Payer: Self-pay | Admitting: Obstetrics and Gynecology

## 2016-08-11 ENCOUNTER — Telehealth: Payer: Self-pay | Admitting: Obstetrics and Gynecology

## 2016-08-11 DIAGNOSIS — Z1231 Encounter for screening mammogram for malignant neoplasm of breast: Secondary | ICD-10-CM

## 2016-08-11 NOTE — Telephone Encounter (Signed)
Patient calling to check on a prescription for vagifem she thought was going to be at the pharmacy.

## 2016-08-11 NOTE — Telephone Encounter (Signed)
Spoke with patient. Patient states prescription for estradiol has expired. Patient scheduled for AEX 09/23/16, Last AEX 07/04/15. Last MMG 06/27/15. Advised patient she would need to schedule MMG prior to next AEX, patient states she is working on getting that scheduled with [facility] offered to assist with scheduling if needed, patient declined at this time. Patients pharmacy updated to reflect current pharmacy in [place], [place]. Advised will review with Dr. Quincy Simmonds and return call with any additional recommendations. Patient is agreeable.   Dr. Quincy Simmonds, please advise on estradiol refill?

## 2016-08-11 NOTE — Telephone Encounter (Signed)
OK for refill of Vagifem 10 mcg tablets. Place one tablet in vaginal twice weekly.  Dispense:  8 RF:  one

## 2016-08-12 DIAGNOSIS — Z23 Encounter for immunization: Secondary | ICD-10-CM | POA: Diagnosis not present

## 2016-08-12 MED ORDER — ESTRADIOL 10 MCG VA TABS: 1.0000 | ORAL_TABLET | VAGINAL | 1 refills | Status: DC

## 2016-08-12 NOTE — Telephone Encounter (Signed)
Spoke with patient, advised as seen below per Dr. Elza Rafter request. New order placed at [place], [place] at patients request. Patient verbalizes understanding and is agreeable.  Routing to provider for final review. Patient is agreeable to disposition. Will close encounter.

## 2016-09-15 ENCOUNTER — Ambulatory Visit
Admission: RE | Admit: 2016-09-15 | Discharge: 2016-09-15 | Disposition: A | Discharge status: Home / Self Care | Payer: Medicare Other | Source: Ambulatory Visit | Attending: Obstetrics and Gynecology | Admitting: Obstetrics and Gynecology

## 2016-09-15 DIAGNOSIS — Z1231 Encounter for screening mammogram for malignant neoplasm of breast: Secondary | ICD-10-CM

## 2016-09-21 DIAGNOSIS — Z1389 Encounter for screening for other disorder: Secondary | ICD-10-CM | POA: Diagnosis not present

## 2016-09-21 DIAGNOSIS — M899 Disorder of bone, unspecified: Secondary | ICD-10-CM | POA: Diagnosis not present

## 2016-09-21 DIAGNOSIS — E559 Vitamin D deficiency, unspecified: Secondary | ICD-10-CM | POA: Diagnosis not present

## 2016-09-21 DIAGNOSIS — R232 Flushing: Secondary | ICD-10-CM | POA: Diagnosis not present

## 2016-09-21 DIAGNOSIS — Z0000 Encounter for general adult medical examination without abnormal findings: Secondary | ICD-10-CM | POA: Diagnosis not present

## 2016-09-21 DIAGNOSIS — E782 Mixed hyperlipidemia: Secondary | ICD-10-CM | POA: Diagnosis not present

## 2016-09-23 ENCOUNTER — Encounter: Payer: Self-pay | Admitting: Obstetrics and Gynecology

## 2016-09-23 ENCOUNTER — Ambulatory Visit (INDEPENDENT_AMBULATORY_CARE_PROVIDER_SITE_OTHER): Payer: Medicare Other | Admitting: Obstetrics and Gynecology

## 2016-09-23 VITALS — BP 104/60 | HR 70 | Resp 16 | Ht 63.75 in | Wt 140.0 lb

## 2016-09-23 DIAGNOSIS — Z01419 Encounter for gynecological examination (general) (routine) without abnormal findings: Secondary | ICD-10-CM | POA: Diagnosis not present

## 2016-09-23 DIAGNOSIS — Z124 Encounter for screening for malignant neoplasm of cervix: Secondary | ICD-10-CM | POA: Diagnosis not present

## 2016-09-23 DIAGNOSIS — Z9189 Other specified personal risk factors, not elsewhere classified: Secondary | ICD-10-CM | POA: Diagnosis not present

## 2016-09-23 NOTE — Progress Notes (Signed)
70 y.o. G0P0000 Single Caucasian female here for annual exam.    Using vaginal estrogen, Vagifem.  Was using only for dryness and she does not really feel symptoms. Not SA.  Black cohosh is not working well for hot flashes.   Mother and sister with breast cancer.  They are BRCA negative.  [place].   PCP:  Leodis Binet - ?  Patient's last menstrual period was 10/04/1996.           Sexually active: No. female The current method of family planning is post menopausal status.    Exercising: Yes.    aerobics Smoker:  no  Health Maintenance: Pap:  07-04-15 Neg:Neg HR HPV History of abnormal Pap:  Yes, 1998 Hx cryotherapy to cervix. MMG:  09-15-16 Density C/Neg/BiRads1:TBC Colonoscopy:  02/27/13 Polyps with Dr.Schooler--[facility] Due 2018  BMD: 2016    Result  PCP - osteopenia.  TDaP:  09-21-16 Gardasil:   N/A Hep C:  Will check PCP. Screening Labs:  Hb today: PCP, Urine today: PCP   reports that she quit smoking about 48 years ago. She has never used smokeless tobacco. She reports that she drinks about 3.0 - 4.2 oz of alcohol per week . She reports that she does not use drugs.  Past Medical History:  Diagnosis Date  . Abnormal Pap smear   . Elevated prolactin level ([place])   . Hematuria     Past Surgical History:  Procedure Laterality Date  . APPENDECTOMY    . CHOLECYSTECTOMY     age 2  . GYNECOLOGIC CRYOSURGERY    . NASAL HEMORRHAGE CONTROL  2012  . STRABISMUS SURGERY     muscle correction  ou eyes  . TUBAL LIGATION  1993   BTL  . VULVAR LESION REMOVAL  1998   biopsy    Current Outpatient Prescriptions  Medication Sig Dispense Refill  . BLACK COHOSH EXTRACT PO Take 1 tablet by mouth 2 (two) times daily.    . Cholecalciferol (VITAMIN D3) 1000 units CAPS Take 1 capsule by mouth daily.    . Estradiol 10 MCG TABS vaginal tablet Place 1 tablet (10 mcg total) vaginally 2 (two) times a week. 8 tablet 1  . Multiple Vitamin (MULTIVITAMIN) capsule Take 1 capsule  by mouth daily.     No current facility-administered medications for this visit.     Family History  Problem Relation Age of Onset  . Breast cancer Mother 39    recurrance at 75  . Pancreatic cancer Mother 28  . Colon cancer Father 2  . Breast cancer Sister 16    ER+ breast cancer, IDC/DCIS  . Lupus Sister     ROS:  Pertinent items are noted in HPI.  Otherwise, a comprehensive ROS was negative.  Exam:   BP 104/60 (BP Location: Right Arm, Patient Position: Sitting, Cuff Size: Normal)   Pulse 70   Resp 16   Ht 5' 3.75" (1.619 m)   Wt 140 lb (63.5 kg)   LMP 10/04/1996   BMI 24.22 kg/m     General appearance: alert, cooperative and appears stated age Head: Normocephalic, without obvious abnormality, atraumatic Neck: no adenopathy, supple, symmetrical, trachea midline and thyroid normal to inspection and palpation Lungs: clear to auscultation bilaterally Breasts: normal appearance, no masses or tenderness, No nipple retraction or dimpling, No nipple discharge or bleeding, No axillary or supraclavicular adenopathy Heart: regular rate and rhythm Abdomen: right oblique scar, soft, non-tender; no masses, no organomegaly Extremities: extremities normal, atraumatic, no cyanosis  or edema Skin: Skin color, texture, turgor normal. No rashes or lesions Lymph nodes: Cervical, supraclavicular, and axillary nodes normal. No abnormal inguinal nodes palpated Neurologic: Grossly normal  Pelvic: External genitalia:  no lesions              Urethra:  normal appearing urethra with no masses, tenderness or lesions              Bartholins and Skenes: normal                 Vagina: normal appearing vagina with normal color and discharge, no lesions              Cervix: no lesions              Pap taken: No. Bimanual Exam:  Uterus:  normal size, contour, position, consistency, mobility, non-tender              Adnexa: no mass, fullness, tenderness              Rectal exam: Yes.  .  Confirms.               Anus:  normal sphincter tone, no lesions  Chaperone was present for exam.  Assessment:   Well woman visit with normal exam. Hx cryo to cervix.  Vaginal atrophy.  Osteopenia. PCP managing. FH breast cancer - mother and sister.  Patient declined BRCA testing after having genetic counseling.  Increased lifetime risk of breast cancer - 21- 40%  Plan: Mammogram screening discussed. I discussed breast MRI with patient. She is interested in proceeding forward.  Recommended self breast awareness. Pap and HR HPV as above. Guidelines for Calcium, Vitamin D, regular exercise program including cardiovascular and weight bearing exercise. She will stop Vagifem after discussing risks and benefits.  Can use cooking oils and water based products. Follow up annually and prn.       After visit summary provided.

## 2016-09-23 NOTE — Progress Notes (Signed)
Spoke with Museum/gallery conservator at [facility]. Patient scheduled for Bilateral breast MRI with and without contrast for FH of Breast Cancer 09/30/16 at 10:30am, arriving at 10:10am. Patient is agreeable to date and time.  Spoke with Joellen Jersey at [facility] -no recent BUN/CR labs drawn. Patient aware this will be drawn at [facility].

## 2016-09-23 NOTE — Patient Instructions (Signed)

## 2016-09-30 ENCOUNTER — Ambulatory Visit
Admission: RE | Admit: 2016-09-30 | Discharge: 2016-09-30 | Disposition: A | Discharge status: Home / Self Care | Payer: Medicare Other | Source: Ambulatory Visit | Attending: Obstetrics and Gynecology | Admitting: Obstetrics and Gynecology

## 2016-09-30 DIAGNOSIS — Z9189 Other specified personal risk factors, not elsewhere classified: Secondary | ICD-10-CM

## 2016-09-30 DIAGNOSIS — Z803 Family history of malignant neoplasm of breast: Secondary | ICD-10-CM | POA: Diagnosis not present

## 2016-09-30 DIAGNOSIS — N6321 Unspecified lump in the left breast, upper outer quadrant: Secondary | ICD-10-CM | POA: Diagnosis not present

## 2016-09-30 MED ORDER — GADOBENATE DIMEGLUMINE 529 MG/ML IV SOLN
13.0000 mL | Freq: Once | INTRAVENOUS | Status: AC | PRN
  Administered 2016-09-30: 13 mL via INTRAVENOUS

## 2016-10-01 ENCOUNTER — Telehealth: Payer: Self-pay | Admitting: Obstetrics and Gynecology

## 2016-10-01 DIAGNOSIS — N6320 Unspecified lump in the left breast, unspecified quadrant: Secondary | ICD-10-CM

## 2016-10-01 NOTE — Telephone Encounter (Signed)
Phone call to patient on cell phone.  No details left.  I requested that the patient return by phone call.   MRI of breast completed.  6 mm mass seen in the left breast and a lymph node in the axillary area is prominent.  Radiologist is recommending proceeding forward with biopsy of the breast mass and possible biopsy of the lymph node.

## 2016-10-04 HISTORY — PX: BREAST BIOPSY: SHX20

## 2016-10-07 NOTE — Telephone Encounter (Signed)
Phone call regarding MRI results.  Left breast mass noted and left axillary lymph node changes.  Biopsy recommended.   Patient is on her way to [place] for the winter and is now in [facility].   Best phone number for contact:  [phone], which is her cell phone.   I am recommending patient proceed forward in [place] with her biopsy.  Our office will start coordinating her care.

## 2016-10-07 NOTE — Telephone Encounter (Signed)
-----   Message from Nunzio Cobbs, MD sent at 10/07/2016  6:06 AM EST ----- Please contact patient in follow up to this MRI.  I called her last week when the results were final, and she did not return my call.  She needs a breast biopsy.  It looks like [employer] released the results to her automatically.

## 2016-10-07 NOTE — Telephone Encounter (Signed)
Spoke with patient, advised as seen below per Dr. Quincy Simmonds. Patient states she is currently on her way to [place] for the winter. Patient asking if Dr. Quincy Simmonds can return call back to further discuss results and recommendations? And if biopsy can be done in [place]? Advised patient will review with Dr. Quincy Simmonds and return call with any additional recommendations. Patient is agreeable.

## 2016-10-08 NOTE — Telephone Encounter (Signed)
Spoke with patient. Patient states she would like to move forward with scheduling the breast biopsy at [facility] in [place]. Patient flexible with dates. Advised patient will return call with appointment date and time. Patient is agreeable.  Spoke with Anderson Malta at [facility]. Patient is scheduled for left breast biopsy at [place]. [place] location 10/13/16 at 0700. Advise patient nothing to eat or drink 4 hrs prior to appt, take morning medications with sip of water, and if any anxiety medications taken will need driver.

## 2016-10-08 NOTE — Telephone Encounter (Signed)
Spoke with patient, advised as seen below. Provided patient with contact information to [facility]. Patient verbalizes understanding and is agreeable to time and date. Advised patient to return call to office for any additional questions.  Routing to provider for final review. Patient is agreeable to disposition. Will close encounter.

## 2016-10-11 ENCOUNTER — Other Ambulatory Visit: Payer: Self-pay | Admitting: Obstetrics and Gynecology

## 2016-10-11 DIAGNOSIS — N6320 Unspecified lump in the left breast, unspecified quadrant: Secondary | ICD-10-CM

## 2016-10-13 ENCOUNTER — Ambulatory Visit
Admission: RE | Admit: 2016-10-13 | Discharge: 2016-10-13 | Disposition: A | Discharge status: Home / Self Care | Payer: Medicare Other | Source: Ambulatory Visit | Attending: Obstetrics and Gynecology | Admitting: Obstetrics and Gynecology

## 2016-10-13 DIAGNOSIS — N6012 Diffuse cystic mastopathy of left breast: Secondary | ICD-10-CM | POA: Diagnosis not present

## 2016-10-13 DIAGNOSIS — N6320 Unspecified lump in the left breast, unspecified quadrant: Secondary | ICD-10-CM

## 2016-10-13 DIAGNOSIS — R928 Other abnormal and inconclusive findings on diagnostic imaging of breast: Secondary | ICD-10-CM | POA: Diagnosis not present

## 2016-10-13 MED ORDER — GADOBENATE DIMEGLUMINE 529 MG/ML IV SOLN
13.0000 mL | Freq: Once | INTRAVENOUS | Status: AC | PRN
  Administered 2016-10-13: 13 mL via INTRAVENOUS

## 2016-10-19 DIAGNOSIS — L601 Onycholysis: Secondary | ICD-10-CM | POA: Diagnosis not present

## 2016-10-19 DIAGNOSIS — L814 Other melanin hyperpigmentation: Secondary | ICD-10-CM | POA: Diagnosis not present

## 2016-10-21 DIAGNOSIS — R928 Other abnormal and inconclusive findings on diagnostic imaging of breast: Secondary | ICD-10-CM | POA: Diagnosis not present

## 2016-10-21 DIAGNOSIS — M8589 Other specified disorders of bone density and structure, multiple sites: Secondary | ICD-10-CM | POA: Diagnosis not present

## 2016-10-21 DIAGNOSIS — Z6823 Body mass index (BMI) 23.0-23.9, adult: Secondary | ICD-10-CM | POA: Diagnosis not present

## 2016-10-21 DIAGNOSIS — E784 Other hyperlipidemia: Secondary | ICD-10-CM | POA: Diagnosis not present

## 2016-11-02 DIAGNOSIS — H2513 Age-related nuclear cataract, bilateral: Secondary | ICD-10-CM | POA: Diagnosis not present

## 2016-11-02 DIAGNOSIS — H53002 Unspecified amblyopia, left eye: Secondary | ICD-10-CM | POA: Diagnosis not present

## 2016-11-02 DIAGNOSIS — H04123 Dry eye syndrome of bilateral lacrimal glands: Secondary | ICD-10-CM | POA: Diagnosis not present

## 2017-01-06 DIAGNOSIS — Z6824 Body mass index (BMI) 24.0-24.9, adult: Secondary | ICD-10-CM | POA: Diagnosis not present

## 2017-01-06 DIAGNOSIS — M67431 Ganglion, right wrist: Secondary | ICD-10-CM | POA: Diagnosis not present

## 2017-03-22 DIAGNOSIS — E785 Hyperlipidemia, unspecified: Secondary | ICD-10-CM | POA: Diagnosis not present

## 2017-03-22 DIAGNOSIS — M85859 Other specified disorders of bone density and structure, unspecified thigh: Secondary | ICD-10-CM | POA: Diagnosis not present

## 2017-03-22 DIAGNOSIS — J0100 Acute maxillary sinusitis, unspecified: Secondary | ICD-10-CM | POA: Diagnosis not present

## 2017-03-22 DIAGNOSIS — E559 Vitamin D deficiency, unspecified: Secondary | ICD-10-CM | POA: Diagnosis not present

## 2017-05-10 DIAGNOSIS — Z6824 Body mass index (BMI) 24.0-24.9, adult: Secondary | ICD-10-CM | POA: Diagnosis not present

## 2017-05-10 DIAGNOSIS — R300 Dysuria: Secondary | ICD-10-CM | POA: Diagnosis not present

## 2017-05-23 ENCOUNTER — Telehealth: Payer: Self-pay | Admitting: *Deleted

## 2017-05-23 DIAGNOSIS — Z803 Family history of malignant neoplasm of breast: Secondary | ICD-10-CM

## 2017-05-23 NOTE — Telephone Encounter (Signed)
Patient in 04 recall for 04-2017. Patient needs follow up breast imagining. Please contact regarding scheduling Thanks

## 2017-06-06 DIAGNOSIS — T887XXA Unspecified adverse effect of drug or medicament, initial encounter: Secondary | ICD-10-CM | POA: Diagnosis not present

## 2017-06-06 DIAGNOSIS — R21 Rash and other nonspecific skin eruption: Secondary | ICD-10-CM | POA: Diagnosis not present

## 2017-06-08 NOTE — Telephone Encounter (Signed)
Spoke with patient and advised it is time for follow up breast imaging. Patient needs Bil.MRI of breast. She had left breast bx 10-14-16 at [facility] with pathology revealing fibrocystic changes--recommended she return for bilateral breast MRI in 6 months. Screening MMG 09-15-16 neg. Family hx Breast Ca in mom and sister--they are BRCA neg. Patient will be out of country from 06[place]17-17 to 07-11-17. Advised will precert MRI and then Winifred will call to schedule. Routed to [facility]

## 2017-06-08 NOTE — Telephone Encounter (Signed)
Order for MRI w wo contrast placed for pre-authorization. Signed under Dr.Jertson as Dr.Silva is out of the office.  Routing to [facility] for pre-authorization.

## 2017-06-09 NOTE — Telephone Encounter (Signed)
Contacted patients primary insurance, Medicare. Authorization is not required, under this plan, for an MRI.  This information has been noted in the referral.   cc: Dr Quincy Simmonds  cc: Reesa Chew, RN

## 2017-06-09 NOTE — Telephone Encounter (Signed)
Patient is scheduled for bilateral breast MRI w wo contrast with [facility] [place] location on 07/13/2017 at 9:15 am. Placed in imaging hold.

## 2017-06-13 DIAGNOSIS — M8588 Other specified disorders of bone density and structure, other site: Secondary | ICD-10-CM | POA: Diagnosis not present

## 2017-06-13 DIAGNOSIS — M810 Age-related osteoporosis without current pathological fracture: Secondary | ICD-10-CM | POA: Diagnosis not present

## 2017-07-05 DIAGNOSIS — L270 Generalized skin eruption due to drugs and medicaments taken internally: Secondary | ICD-10-CM | POA: Diagnosis not present

## 2017-07-05 DIAGNOSIS — Z23 Encounter for immunization: Secondary | ICD-10-CM | POA: Diagnosis not present

## 2017-07-05 DIAGNOSIS — M810 Age-related osteoporosis without current pathological fracture: Secondary | ICD-10-CM | POA: Diagnosis not present

## 2017-07-13 ENCOUNTER — Ambulatory Visit
Admission: RE | Admit: 2017-07-13 | Discharge: 2017-07-13 | Disposition: A | Discharge status: Home / Self Care | Payer: Medicare Other | Source: Ambulatory Visit | Attending: Obstetrics and Gynecology | Admitting: Obstetrics and Gynecology

## 2017-07-13 DIAGNOSIS — N6489 Other specified disorders of breast: Secondary | ICD-10-CM | POA: Diagnosis not present

## 2017-07-13 DIAGNOSIS — Z803 Family history of malignant neoplasm of breast: Secondary | ICD-10-CM

## 2017-07-13 MED ORDER — GADOBENATE DIMEGLUMINE 529 MG/ML IV SOLN
13.0000 mL | Freq: Once | INTRAVENOUS | Status: AC | PRN
  Administered 2017-07-13: 13 mL via INTRAVENOUS

## 2017-10-10 ENCOUNTER — Ambulatory Visit: Payer: Medicare Other | Admitting: Obstetrics and Gynecology

## 2017-10-20 DIAGNOSIS — D1801 Hemangioma of skin and subcutaneous tissue: Secondary | ICD-10-CM | POA: Diagnosis not present

## 2017-10-20 DIAGNOSIS — L821 Other seborrheic keratosis: Secondary | ICD-10-CM | POA: Diagnosis not present

## 2017-11-05 IMAGING — MR MR BILATERAL BREAST WITHOUT AND WITH CONTRAST
8 of 12 series · 29 of 48 positions shown · IV contrast (13ml Multihance)
Comparison: Bilateral breast MRI I September 30, 2016 and MRI guided
breast biopsy October 13, 2016. Bilateral screening mammogram
September 15, 2016.

CLINICAL DATA: Six-month follow-up breast MRI. 71-year-old patient
has a family history of breast cancer. She underwent a left breast
MRI guided biopsy in Tuesday October, 2016, with benign fibrocystic
changes at pathology. This was found to be concordant by Dr. Anjani
and a six-month follow-up breast MRI was recommended. The patient
presents for the follow-up today.

LABS:  Creatinine was obtained on site at [HOSPITAL] at [REDACTED] [HOSPITAL].
Results: Creatinine 0.8 mg/dL.
EXAM:
BILATERAL BREAST MRI WITH AND WITHOUT CONTRAST
TECHNIQUE: Multiplanar, multisequence MR images of both breasts were obtained
prior to and following the intravenous administration of 13 ml of
MultiHance.

[Series 2: t2_tirm_tra ipat (a-p) · axial · 3.0mm · 0.70mm/px · 1 of 65 slices shown]
[im 1/65]
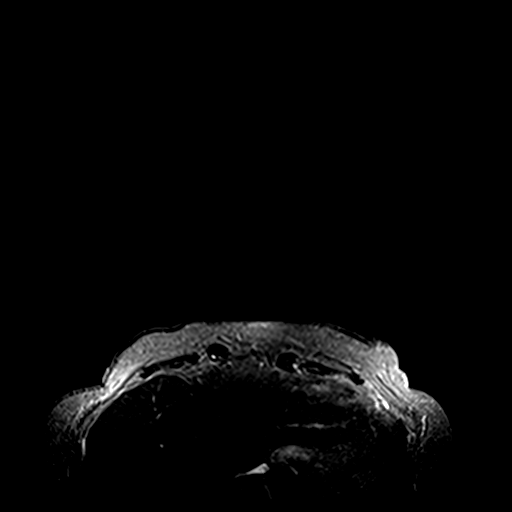

[Series 3: fl3d pre-cm no · axial · non-contrast · 0.9mm · 0.94mm/px · z∈[-75,+111]mm · 5 of 208 slices shown]
[im 1/208]
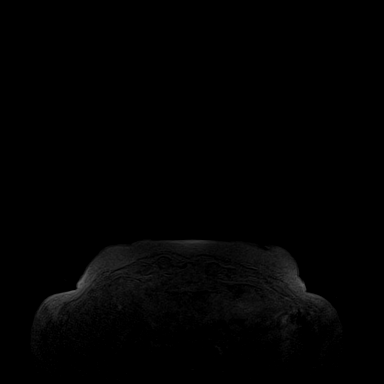
[im 52/208]
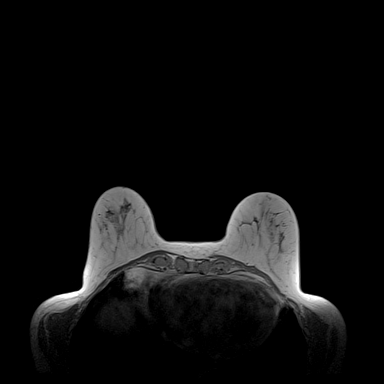
[im 104/208]
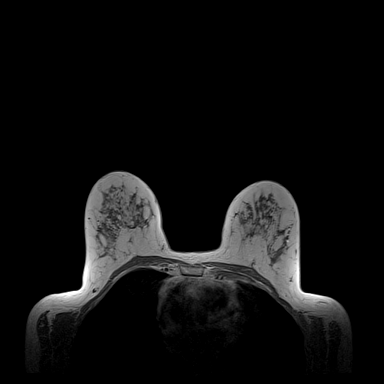
[im 156/208]
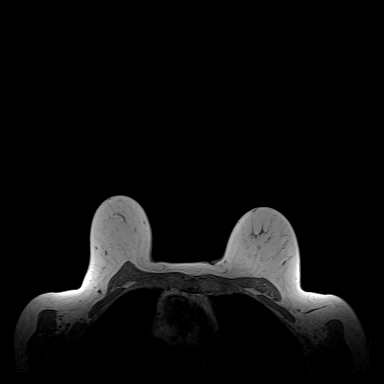
[im 208/208]
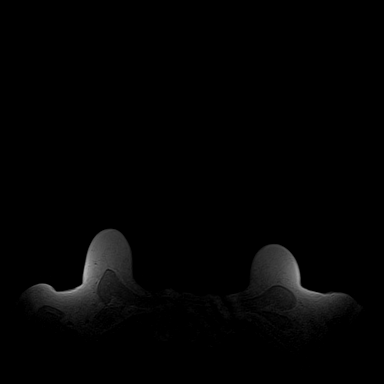

[Series 4: fl3d pre-cm · axial · non-contrast · 0.9mm · 0.87mm/px · z∈[-75,+111]mm · 5 of 208 slices shown]
[im 1/208]
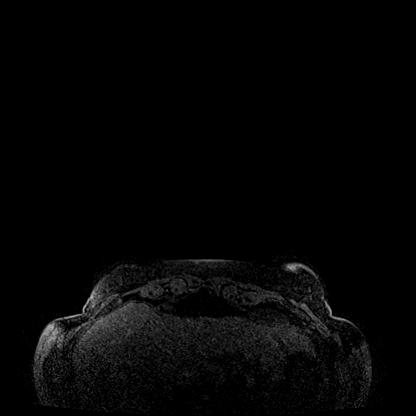
[im 52/208]
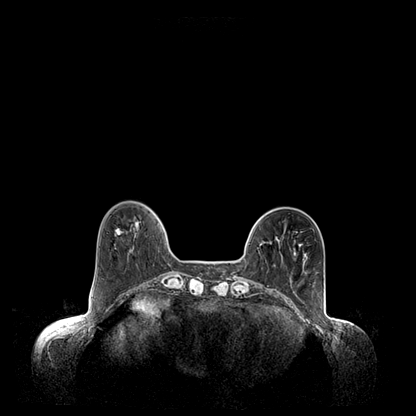
[im 104/208]
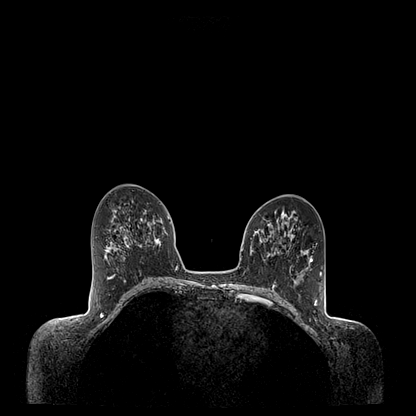
[im 156/208]
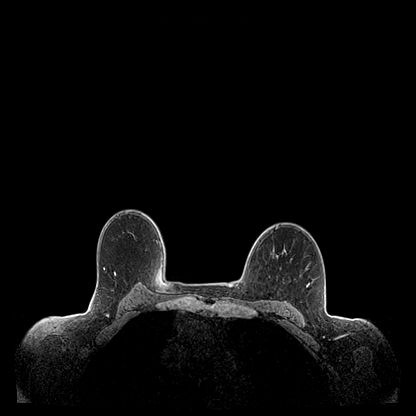
[im 208/208]
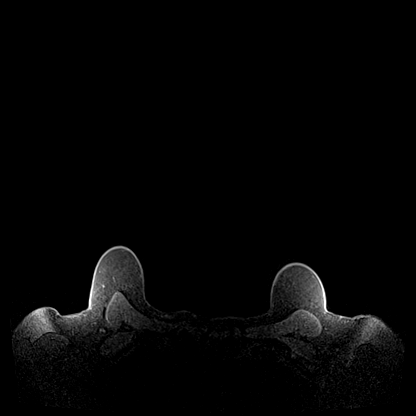

[Series 5: fl3d post-cm 20 · axial · 0.9mm · 0.87mm/px · z∈[-75,+111]mm · 5 of 208 slices shown (1 of 3)]
[im 1/208]
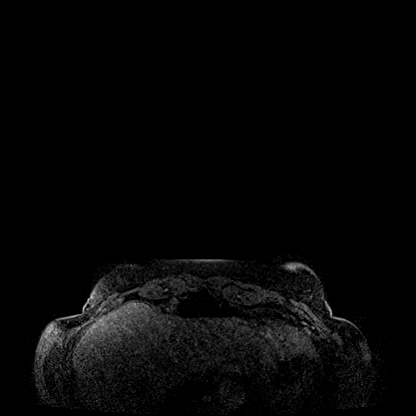
[im 52/208]
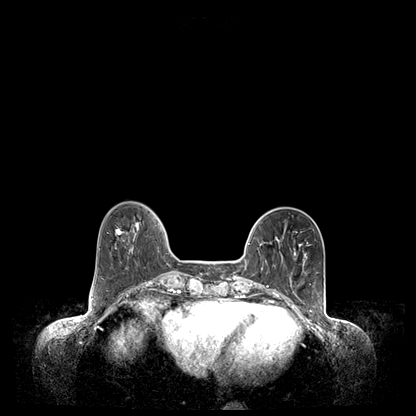
[im 104/208]
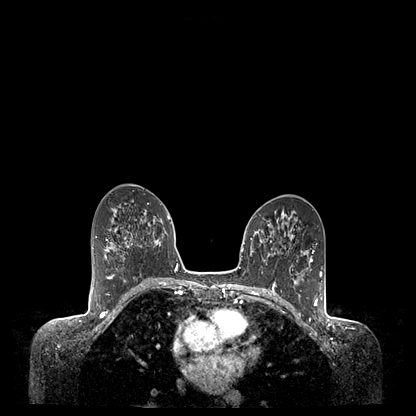
[im 156/208]
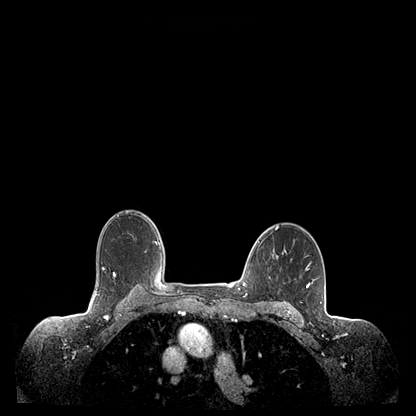
[im 208/208]
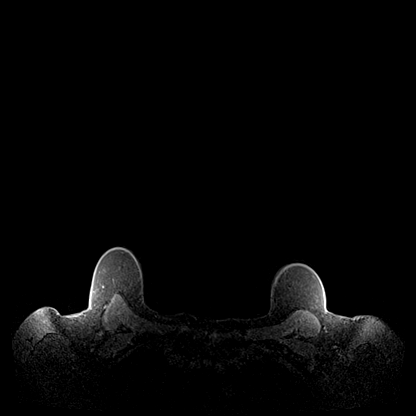

[Series 6: fl3d post-cm 20 · axial · 0.9mm · 0.87mm/px · z∈[-75,+111]mm · 5 of 208 slices shown (2 of 3)]
[im 1/208]
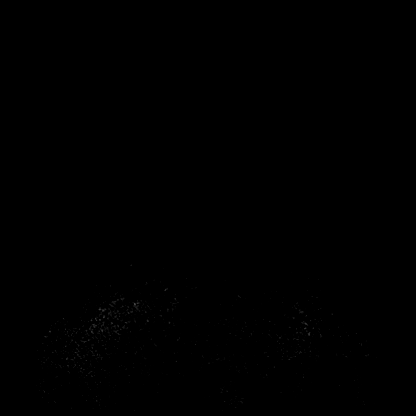
[im 52/208]
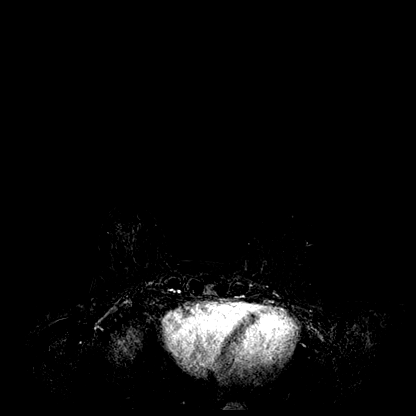
[im 104/208]
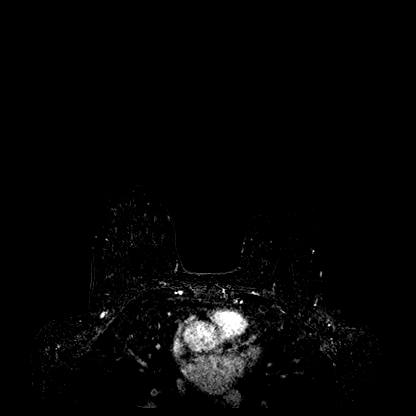
[im 156/208]
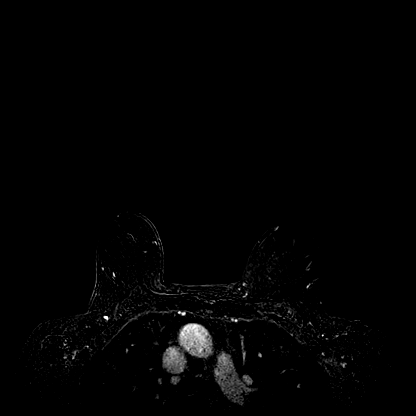
[im 208/208]
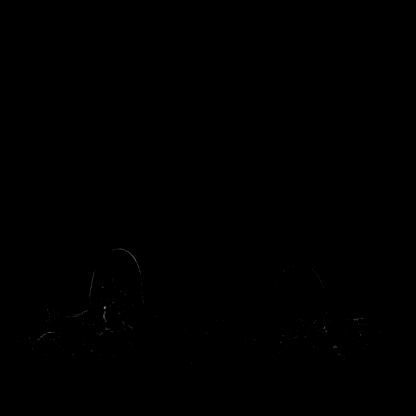

[Series 7: fl3d post-cm 20 · axial · 187.2mm · 0.87mm/px · 1 of 1 slices shown (3 of 3)]
[im 1/1]
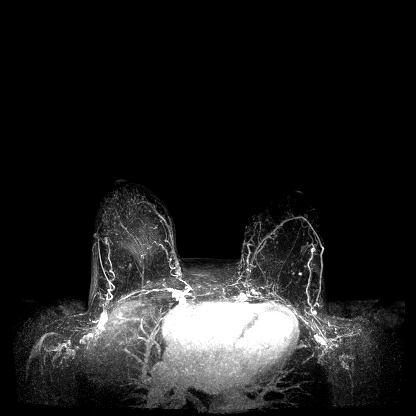

[Series 8: fl3d post-cm 3min · axial · 0.9mm · 0.87mm/px · z∈[-75,+111]mm · 6 of 208 slices shown]
[im 1/208]
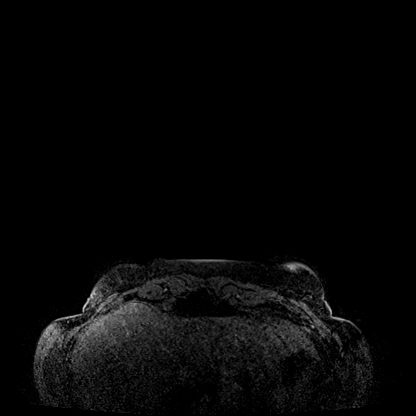
[im 42/208]
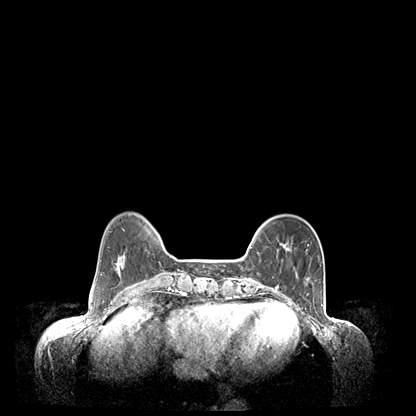
[im 83/208]
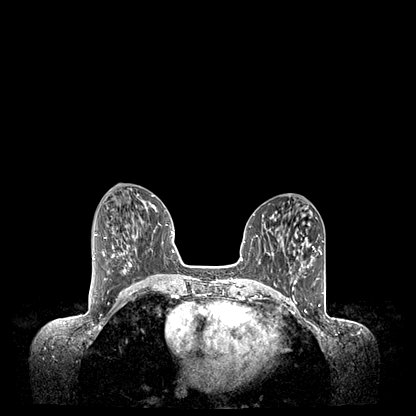
[im 125/208]
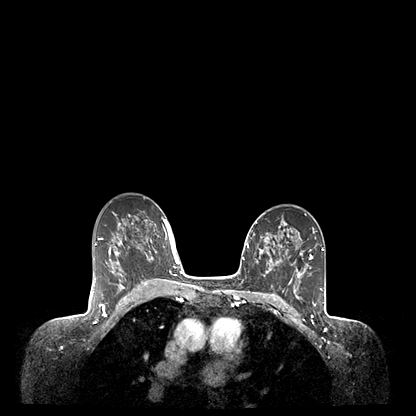
[im 166/208]
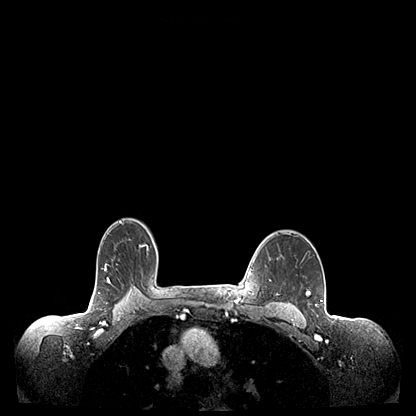
[im 208/208]
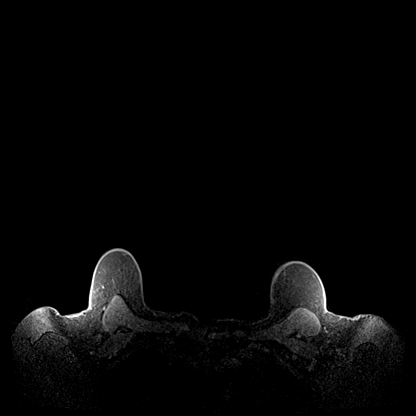

[Series 9: fl3d post-cm 3min_sub · axial · 0.9mm · 0.87mm/px · 1 of 208 slices shown]
[im 1/208]
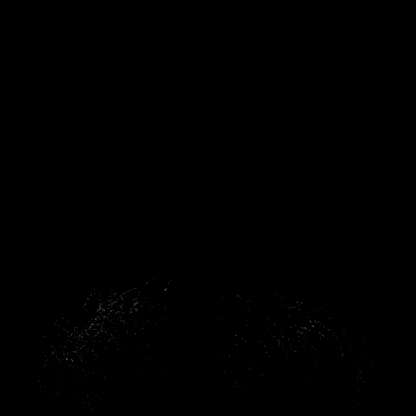

[29 of 48 positions shown; findings below may reference images not displayed]

THREE-DIMENSIONAL MR IMAGE RENDERING ON INDEPENDENT WORKSTATION:

Three-dimensional MR images were rendered by post-processing of the
original MR data on an independent workstation. The
three-dimensional MR images were interpreted, and findings are
reported in the following complete MRI report for this study. Three
dimensional images were evaluated at the independent [employer]
workstation
FINDINGS: Breast composition: c. Heterogeneous fibroglandular tissue.

Background parenchymal enhancement: Mild

Right breast: No mass or abnormal enhancement in the right breast.

Left breast: No mass or abnormal enhancement in the left breast.
Biopsy clip artifact is seen in the medial left breast at the site
of prior MRI guided biopsy in October 2016. No suspicious findings
are seen in this region of the left breast.

Lymph nodes: No abnormal appearing lymph nodes.

Ancillary findings:  Left hepatic cyst again noted.
IMPRESSION: No MRI evidence of malignancy in either breast. Biopsy clip artifact
in the medial left breast.

RECOMMENDATION:
Bilateral screening mammogram is recommended in September 2017.

A bilateral screening breast MRI could be considered in 1 year.

BI-RADS CATEGORY  1: Negative.

## 2017-12-02 DIAGNOSIS — H01005 Unspecified blepharitis left lower eyelid: Secondary | ICD-10-CM | POA: Diagnosis not present

## 2017-12-02 DIAGNOSIS — H35373 Puckering of macula, bilateral: Secondary | ICD-10-CM | POA: Diagnosis not present

## 2017-12-02 DIAGNOSIS — H2513 Age-related nuclear cataract, bilateral: Secondary | ICD-10-CM | POA: Diagnosis not present

## 2017-12-02 DIAGNOSIS — H01002 Unspecified blepharitis right lower eyelid: Secondary | ICD-10-CM | POA: Diagnosis not present

## 2017-12-19 DIAGNOSIS — M810 Age-related osteoporosis without current pathological fracture: Secondary | ICD-10-CM | POA: Diagnosis not present

## 2017-12-19 DIAGNOSIS — Z1389 Encounter for screening for other disorder: Secondary | ICD-10-CM | POA: Diagnosis not present

## 2017-12-19 DIAGNOSIS — E785 Hyperlipidemia, unspecified: Secondary | ICD-10-CM | POA: Diagnosis not present

## 2017-12-19 DIAGNOSIS — Z0000 Encounter for general adult medical examination without abnormal findings: Secondary | ICD-10-CM | POA: Diagnosis not present

## 2017-12-19 DIAGNOSIS — Z1211 Encounter for screening for malignant neoplasm of colon: Secondary | ICD-10-CM | POA: Diagnosis not present

## 2017-12-19 DIAGNOSIS — E559 Vitamin D deficiency, unspecified: Secondary | ICD-10-CM | POA: Diagnosis not present

## 2017-12-21 ENCOUNTER — Other Ambulatory Visit: Payer: Self-pay | Admitting: Obstetrics and Gynecology

## 2017-12-21 DIAGNOSIS — Z1231 Encounter for screening mammogram for malignant neoplasm of breast: Secondary | ICD-10-CM

## 2018-01-23 ENCOUNTER — Ambulatory Visit: Payer: Medicare Other | Admitting: Obstetrics and Gynecology

## 2018-03-21 ENCOUNTER — Ambulatory Visit
Admission: RE | Admit: 2018-03-21 | Discharge: 2018-03-21 | Disposition: A | Discharge status: Home / Self Care | Payer: Medicare Other | Source: Ambulatory Visit | Attending: Obstetrics and Gynecology | Admitting: Obstetrics and Gynecology

## 2018-03-21 DIAGNOSIS — Z1231 Encounter for screening mammogram for malignant neoplasm of breast: Secondary | ICD-10-CM | POA: Diagnosis not present

## 2018-04-05 ENCOUNTER — Other Ambulatory Visit (HOSPITAL_COMMUNITY)
Admission: RE | Admit: 2018-04-05 | Discharge: 2018-04-05 | Disposition: A | Discharge status: Home / Self Care | Payer: Medicare Other | Source: Ambulatory Visit | Attending: Obstetrics and Gynecology | Admitting: Obstetrics and Gynecology

## 2018-04-05 ENCOUNTER — Encounter: Payer: Self-pay | Admitting: Obstetrics and Gynecology

## 2018-04-05 ENCOUNTER — Encounter

## 2018-04-05 ENCOUNTER — Ambulatory Visit: Payer: Medicare Other | Admitting: Obstetrics and Gynecology

## 2018-04-05 ENCOUNTER — Other Ambulatory Visit: Payer: Self-pay

## 2018-04-05 VITALS — BP 112/70 | HR 92 | Resp 16 | Ht 63.75 in | Wt 141.0 lb

## 2018-04-05 DIAGNOSIS — Z01419 Encounter for gynecological examination (general) (routine) without abnormal findings: Secondary | ICD-10-CM | POA: Insufficient documentation

## 2018-04-05 DIAGNOSIS — Z9189 Other specified personal risk factors, not elsewhere classified: Secondary | ICD-10-CM | POA: Diagnosis not present

## 2018-04-05 NOTE — Patient Instructions (Addendum)
EXERCISE AND DIET:  We recommended that you start or continue a regular exercise program for good health. Regular exercise means any activity that makes your heart beat faster and makes you sweat.  We recommend exercising at least 30 minutes per day at least 3 days a week, preferably 4 or 5.  We also recommend a diet low in fat and sugar.  Inactivity, poor dietary choices and obesity can cause diabetes, heart attack, stroke, and kidney damage, among others.    ALCOHOL AND SMOKING:  Women should limit their alcohol intake to no more than 7 drinks/beers/glasses of wine (combined, not each!) per week. Moderation of alcohol intake to this level decreases your risk of breast cancer and liver damage. And of course, no recreational drugs are part of a healthy lifestyle.  And absolutely no smoking or even second hand smoke. Most people know smoking can cause heart and lung diseases, but did you know it also contributes to weakening of your bones? Aging of your skin?  Yellowing of your teeth and nails?  CALCIUM AND VITAMIN D:  Adequate intake of calcium and Vitamin D are recommended.  The recommendations for exact amounts of these supplements seem to change often, but generally speaking 1200 mg of calcium (either carbonate or citrate) and 800 units of Vitamin D per day seems prudent. Certain women may benefit from higher intake of Vitamin D.  If you are among these women, your doctor will have told you during your visit.    PAP SMEARS:  Pap smears, to check for cervical cancer or precancers,  have traditionally been done yearly, although recent scientific advances have shown that most women can have pap smears less often.  However, every woman still should have a physical exam from her gynecologist every year. It will include a breast check, inspection of the vulva and vagina to check for abnormal growths or skin changes, a visual exam of the cervix, and then an exam to evaluate the size and shape of the uterus and  ovaries.  And after 73 years of age, a rectal exam is indicated to check for rectal cancers. We will also provide age appropriate advice regarding health maintenance, like when you should have certain vaccines, screening for sexually transmitted diseases, bone density testing, colonoscopy, mammograms, etc.   MAMMOGRAMS:  All women over 69 years old should have a yearly mammogram. Many facilities now offer a "3D" mammogram, which may cost around $50 extra out of pocket. If possible,  we recommend you accept the option to have the 3D mammogram performed.  It both reduces the number of women who will be called back for extra views which then turn out to be normal, and it is better than the routine mammogram at detecting truly abnormal areas.    COLONOSCOPY:  Colonoscopy to screen for colon cancer is recommended for all women at age 36.  We know, you hate the idea of the prep.  We agree, BUT, having colon cancer and not knowing it is worse!!  Colon cancer so often starts as a polyp that can be seen and removed at colonscopy, which can quite literally save your life!  And if your first colonoscopy is normal and you have no family history of colon cancer, most women don't have to have it again for 10 years.  Once every ten years, you can do something that may end up saving your life, right?  We will be happy to help you get it scheduled when you are ready.  Be sure to check your insurance coverage so you understand how much it will cost.  It may be covered as a preventative service at no cost, but you should check your particular policy.       Osteoporosis Osteoporosis is the thinning and loss of density in the bones. Osteoporosis makes the bones more brittle, fragile, and likely to break (fracture). Over time, osteoporosis can cause the bones to become so weak that they fracture after a simple fall. The bones most likely to fracture are the bones in the hip, wrist, and spine. What are the causes? The exact  cause is not known. What increases the risk? Anyone can develop osteoporosis. You may be at greater risk if you have a family history of the condition or have poor nutrition. You may also have a higher risk if you are:  Female.  30 years old or older.  A smoker.  Not physically active.  White or Asian.  Slender.  What are the signs or symptoms? A fracture might be the first sign of the disease, especially if it results from a fall or injury that would not usually cause a bone to break. Other signs and symptoms include:  Low back and neck pain.  Stooped posture.  Height loss.  How is this diagnosed? To make a diagnosis, your health care provider may:  Take a medical history.  Perform a physical exam.  Order tests, such as: ? A bone mineral density test. ? A dual-energy X-ray absorptiometry test.  How is this treated? The goal of osteoporosis treatment is to strengthen your bones to reduce your risk of a fracture. Treatment may involve:  Making lifestyle changes, such as: ? Eating a diet rich in calcium. ? Doing weight-bearing and muscle-strengthening exercises. ? Stopping tobacco use. ? Limiting alcohol intake.  Taking medicine to slow the process of bone loss or to increase bone density.  Monitoring your levels of calcium and vitamin D.  Follow these instructions at home:  Include calcium and vitamin D in your diet. Calcium is important for bone health, and vitamin D helps the body absorb calcium.  Perform weight-bearing and muscle-strengthening exercises as directed by your health care provider.  Do not use any tobacco products, including cigarettes, chewing tobacco, and electronic cigarettes. If you need help quitting, ask your health care provider.  Limit your alcohol intake.  Take medicines only as directed by your health care provider.  Keep all follow-up visits as directed by your health care provider. This is important.  Take precautions at home  to lower your risk of falling, such as: ? Keeping rooms well lit and clutter free. ? Installing safety rails on stairs. ? Using rubber mats in the bathroom and other areas that are often wet or slippery. Get help right away if: You fall or injure yourself. This information is not intended to replace advice given to you by your health care provider. Make sure you discuss any questions you have with your health care provider. Document Released: 06/30/2005 Document Revised: 02/23/2016 Document Reviewed: 02/28/2014 Elsevier Interactive Patient Education  2018 [employer].   Preventing Osteoporosis, Adult Osteoporosis is a condition that causes the bones to get weaker. With osteoporosis, the bones become thinner, and the normal spaces in bone tissue become larger. This can make the bones weak and cause them to break more easily. People who have osteoporosis are more likely to break their wrist, spine, or hip. Even a minor accident or injury can be enough to  break weak bones. Osteoporosis can occur with aging. Your body constantly replaces old bone tissue with new tissue. As you get older, you may lose bone tissue more quickly, or it may be replaced more slowly. Osteoporosis is more likely to develop if you have poor nutrition or do not get enough calcium or vitamin D. Other lifestyle factors can also play a role. By making some diet and lifestyle changes, you can help to keep your bones healthy and help to prevent osteoporosis. What nutrition changes can be made? Nutrition plays an important role in maintaining healthy, strong bones.  Make sure you get enough calcium every day from food or from calcium supplements. ? If you are age 28 or younger, aim to get 1,000 mg of calcium every day. ? If you are older than age 18, aim to get 1,200 mg of calcium every day.  Try to get enough vitamin D every day. ? If you are age 45 or younger, aim to get 600 international units (IU) every day. ? If you are  older than age 73, aim to get 800 international units every day.  Follow a healthy diet. Eat plenty of foods that contain calcium and vitamin D. ? Calcium is in milk, cheese, yogurt, and other dairy products. Some fish and vegetables are also good sources of calcium. Many foods such as cereals and breads have had calcium added to them (are fortified). Check nutrition labels to see how much calcium is in a food or drink. ? Foods that contain vitamin D include milk, cereals, salmon, and tuna. Your body also makes vitamin D when you are out in the sun. Bare skin exposure to the sun on your face, arms, legs, or back for no more than 30 minutes a day, 2 times per week is more than enough. Beyond that, it is important to use sunblock to protect your skin from sunburn, which increases your risk for skin cancer.  What lifestyle changes can be made? Making changes in your everyday life can also play an important role in preventing osteoporosis.  Stay active and get exercise every day. Ask your health care provider what types of exercise are best for you.  Do not use any products that contain nicotine or tobacco, such as cigarettes and e-cigarettes. If you need help quitting, ask your health care provider.  Limit alcohol intake to no more than 1 drink a day for nonpregnant women and 2 drinks a day for men. One drink equals 12 oz of beer, 5 oz of wine, or 1 oz of hard liquor.  Why are these changes important? Making these nutrition and lifestyle changes can:  Help you develop and maintain healthy, strong bones.  Prevent loss of bone mass and the problems that are caused by that loss, such as broken bones and delayed healing.  Make you feel better mentally and physically.  What can happen if changes are not made? Problems that can result from osteoporosis can be very serious. These may include:  A higher risk of broken bones that are painful and do not heal well.  Physical malformations, such as a  collapsed spine or a hunched back.  Problems with movement.  Where to find support: If you need help making changes to prevent osteoporosis, talk with your health care provider. You can ask for a referral to a diet and nutrition specialist (dietitian) and a physical therapist. Where to find more information: Learn more about osteoporosis from:  NIH Osteoporosis and Related Bone Diseases  [place]: www.niams.GolfingGoddess.com.br  U.S. Office on Women's Health: SouvenirBaseball.es.html  National Osteoporosis Foundation: ProfilePeek.ch  Summary  Osteoporosis is a condition that causes weak bones that are more likely to break.  Eating a healthy diet and making sure you get enough calcium and vitamin D can help prevent osteoporosis.  Other ways to reduce your risk of osteoporosis include getting regular exercise and avoiding alcohol and products that contain nicotine or tobacco. This information is not intended to replace advice given to you by your health care provider. Make sure you discuss any questions you have with your health care provider. Document Released: 10/05/2015 Document Revised: 05/31/2016 Document Reviewed: 05/31/2016 Elsevier Interactive Patient Education  [employer].

## 2018-04-05 NOTE — Progress Notes (Signed)
72 y.o. G0P0000 Single Caucasian female here for annual exam.    Now on Fosamax due to worsening bone density.  Being treated through her PCP.  BMD was done at [facility].   Off HRT.   Weaned off starting in 2015  PCP: Dr. Jossie Ng [facility]    Patient's last menstrual period was 10/04/1996.           Sexually active: No.  The current method of family planning is post menopausal status.    Exercising: Yes.    water and bike Smoker:  no  Health Maintenance: Pap:  07-04-15 Neg:Neg HR HPV History of abnormal Pap:  Yes, 1998 Hx cryotherapy to cervix MMG:  03/21/18 BIRADS 1 negative/density c.   Colonoscopy:  02/27/13 Polyps with Dr.Schooler--[facility] Due 2018. She will schedule.  BMD:   2018  Result  Per patient done at [place]:  09/21/16 Gardasil:   n/a HIV: unsure Hep C: unsure Screening Labs: PCP   reports that she quit smoking about 49 years ago. She has never used smokeless tobacco. She reports that she drinks about 3.0 - 4.2 oz of alcohol per week. She reports that she does not use drugs.  Past Medical History:  Diagnosis Date  . Abnormal Pap smear   . Elevated prolactin level ([place])   . Hematuria     Past Surgical History:  Procedure Laterality Date  . APPENDECTOMY    . BREAST BIOPSY Left   . CHOLECYSTECTOMY     age 65  . GYNECOLOGIC CRYOSURGERY    . NASAL HEMORRHAGE CONTROL  2012  . STRABISMUS SURGERY     muscle correction  ou eyes  . TUBAL LIGATION  1993   BTL  . VULVAR LESION REMOVAL  1998   biopsy    Current Outpatient Medications  Medication Sig Dispense Refill  . alendronate (FOSAMAX) 70 MG tablet 1 TABLET ONCE A WEEK ORALLY  2  . Cholecalciferol (VITAMIN D3) 1000 units CAPS Take 1 capsule by mouth daily.    Javier Docker Oil 500 MG CAPS Take 1 capsule by mouth daily.    . Multiple Vitamin (MULTIVITAMIN) capsule Take 1 capsule by mouth daily.    . Probiotic Product (ALIGN PO) Take 1 tablet by mouth daily.     No current  facility-administered medications for this visit.     Family History  Problem Relation Age of Onset  . Breast cancer Mother 34       recurrance at 69  . Pancreatic cancer Mother 59  . Colon cancer Father 52  . Breast cancer Sister 53       ER+ breast cancer, IDC/DCIS  . Lupus Sister     Review of Systems  Constitutional: Negative.   HENT: Negative.   Eyes: Negative.   Respiratory: Negative.   Cardiovascular: Negative.   Gastrointestinal: Negative.   Endocrine: Negative.   Genitourinary: Negative.   Musculoskeletal: Negative.   Skin: Negative.   Allergic/Immunologic: Negative.   Neurological: Negative.   Hematological: Negative.   Psychiatric/Behavioral: Negative.     Exam:   BP 112/70 (BP Location: Right Arm, Patient Position: Sitting, Cuff Size: Normal)   Pulse 92   Resp 16   Ht 5' 3.75" (1.619 m)   Wt 141 lb (64 kg)   LMP 10/04/1996   BMI 24.39 kg/m     General appearance: alert, cooperative and appears stated age Head: Normocephalic, without obvious abnormality, atraumatic Neck: no adenopathy, supple, symmetrical, trachea midline and  thyroid normal to inspection and palpation Lungs: clear to auscultation bilaterally Breasts: normal appearance, no masses or tenderness, No nipple retraction or dimpling, No nipple discharge or bleeding, No axillary or supraclavicular adenopathy Heart: regular rate and rhythm Abdomen: soft, non-tender; no masses, no organomegaly Extremities: extremities normal, atraumatic, no cyanosis or edema Skin: Skin color, texture, turgor normal. No rashes or lesions Lymph nodes: Cervical, supraclavicular, and axillary nodes normal. No abnormal inguinal nodes palpated Neurologic: Grossly normal  Pelvic: External genitalia:  no lesions              Urethra:  normal appearing urethra with no masses, tenderness or lesions              Bartholins and Skenes: normal                 Vagina: normal appearing vagina with normal color and discharge,  no lesions              Cervix: no lesions              Pap taken: Yes.   Bimanual Exam:  Uterus:  normal size, contour, position, consistency, mobility, non-tender              Adnexa: no mass, fullness, tenderness              Rectal exam: Yes.  .  Confirms.              Anus:  normal sphincter tone, no lesions  Chaperone was present for exam.  Assessment:   Well woman visit with normal exam. Hx cryo to cervix.  Vaginal atrophy.  Osteopenia. PCP managing.  On Fosamax.  FH breast cancer - mother and sister.  They are BRCA negative. Patient declined BRCA testing after having genetic counseling.  Increased lifetime risk of breast cancer - 21- 40%  Plan: Mammogram screening. Recommended self breast awareness. Pap and HR HPV as above. Guidelines for Calcium, Vitamin D, regular exercise program including cardiovascular and weight bearing exercise. Yearly breast MRI.  We discussed osteoporosis.  Follow up annually and prn.   After visit summary provided.

## 2018-04-11 ENCOUNTER — Other Ambulatory Visit: Payer: Self-pay | Admitting: *Deleted

## 2018-04-11 DIAGNOSIS — Z1231 Encounter for screening mammogram for malignant neoplasm of breast: Secondary | ICD-10-CM

## 2018-04-11 DIAGNOSIS — Z9189 Other specified personal risk factors, not elsewhere classified: Secondary | ICD-10-CM

## 2018-04-12 LAB — CYTOLOGY - PAP: Diagnosis: NEGATIVE

## 2018-07-07 DIAGNOSIS — K5730 Diverticulosis of large intestine without perforation or abscess without bleeding: Secondary | ICD-10-CM | POA: Diagnosis not present

## 2018-07-07 DIAGNOSIS — D123 Benign neoplasm of transverse colon: Secondary | ICD-10-CM | POA: Diagnosis not present

## 2018-07-07 DIAGNOSIS — K640 First degree hemorrhoids: Secondary | ICD-10-CM | POA: Diagnosis not present

## 2018-07-07 DIAGNOSIS — Z86010 Personal history of colonic polyps: Secondary | ICD-10-CM | POA: Diagnosis not present

## 2018-07-11 DIAGNOSIS — D123 Benign neoplasm of transverse colon: Secondary | ICD-10-CM | POA: Diagnosis not present

## 2018-08-02 DIAGNOSIS — Z23 Encounter for immunization: Secondary | ICD-10-CM | POA: Diagnosis not present

## 2018-10-25 DIAGNOSIS — D1801 Hemangioma of skin and subcutaneous tissue: Secondary | ICD-10-CM | POA: Diagnosis not present

## 2018-10-25 DIAGNOSIS — L230 Allergic contact dermatitis due to metals: Secondary | ICD-10-CM | POA: Diagnosis not present

## 2018-10-25 DIAGNOSIS — L821 Other seborrheic keratosis: Secondary | ICD-10-CM | POA: Diagnosis not present

## 2018-10-25 DIAGNOSIS — L814 Other melanin hyperpigmentation: Secondary | ICD-10-CM | POA: Diagnosis not present

## 2018-10-27 DIAGNOSIS — Z6825 Body mass index (BMI) 25.0-25.9, adult: Secondary | ICD-10-CM | POA: Diagnosis not present

## 2018-10-27 DIAGNOSIS — Z23 Encounter for immunization: Secondary | ICD-10-CM | POA: Diagnosis not present

## 2018-10-27 DIAGNOSIS — Z713 Dietary counseling and surveillance: Secondary | ICD-10-CM | POA: Diagnosis not present

## 2018-10-27 DIAGNOSIS — R091 Pleurisy: Secondary | ICD-10-CM | POA: Diagnosis not present

## 2018-11-09 ENCOUNTER — Other Ambulatory Visit: Payer: Medicare Other

## 2018-11-29 DIAGNOSIS — J018 Other acute sinusitis: Secondary | ICD-10-CM | POA: Diagnosis not present

## 2018-11-29 DIAGNOSIS — Z6824 Body mass index (BMI) 24.0-24.9, adult: Secondary | ICD-10-CM | POA: Diagnosis not present

## 2019-02-06 DIAGNOSIS — H04123 Dry eye syndrome of bilateral lacrimal glands: Secondary | ICD-10-CM | POA: Diagnosis not present

## 2019-02-06 DIAGNOSIS — H01005 Unspecified blepharitis left lower eyelid: Secondary | ICD-10-CM | POA: Diagnosis not present

## 2019-02-06 DIAGNOSIS — H2513 Age-related nuclear cataract, bilateral: Secondary | ICD-10-CM | POA: Diagnosis not present

## 2019-02-06 DIAGNOSIS — H01002 Unspecified blepharitis right lower eyelid: Secondary | ICD-10-CM | POA: Diagnosis not present

## 2019-02-28 ENCOUNTER — Other Ambulatory Visit: Payer: Self-pay | Admitting: Internal Medicine

## 2019-02-28 DIAGNOSIS — Z1231 Encounter for screening mammogram for malignant neoplasm of breast: Secondary | ICD-10-CM

## 2019-03-16 ENCOUNTER — Other Ambulatory Visit: Payer: Self-pay | Admitting: Internal Medicine

## 2019-03-16 DIAGNOSIS — Z1239 Encounter for other screening for malignant neoplasm of breast: Secondary | ICD-10-CM | POA: Diagnosis not present

## 2019-03-16 DIAGNOSIS — Z1211 Encounter for screening for malignant neoplasm of colon: Secondary | ICD-10-CM | POA: Diagnosis not present

## 2019-03-16 DIAGNOSIS — E559 Vitamin D deficiency, unspecified: Secondary | ICD-10-CM | POA: Diagnosis not present

## 2019-03-16 DIAGNOSIS — Z86010 Personal history of colonic polyps: Secondary | ICD-10-CM | POA: Diagnosis not present

## 2019-03-16 DIAGNOSIS — Z0000 Encounter for general adult medical examination without abnormal findings: Secondary | ICD-10-CM | POA: Diagnosis not present

## 2019-03-16 DIAGNOSIS — E785 Hyperlipidemia, unspecified: Secondary | ICD-10-CM | POA: Diagnosis not present

## 2019-03-16 DIAGNOSIS — M810 Age-related osteoporosis without current pathological fracture: Secondary | ICD-10-CM | POA: Diagnosis not present

## 2019-03-31 ENCOUNTER — Other Ambulatory Visit: Payer: Self-pay

## 2019-03-31 ENCOUNTER — Ambulatory Visit
Admission: RE | Admit: 2019-03-31 | Discharge: 2019-03-31 | Disposition: A | Discharge status: Home / Self Care | Payer: Medicare Other | Source: Ambulatory Visit | Attending: Internal Medicine | Admitting: Internal Medicine

## 2019-03-31 DIAGNOSIS — Z1231 Encounter for screening mammogram for malignant neoplasm of breast: Secondary | ICD-10-CM | POA: Diagnosis not present

## 2019-04-27 ENCOUNTER — Ambulatory Visit: Payer: Medicare Other | Admitting: Obstetrics and Gynecology

## 2019-05-22 ENCOUNTER — Ambulatory Visit: Payer: Medicare Other | Admitting: Obstetrics and Gynecology

## 2019-06-18 ENCOUNTER — Ambulatory Visit
Admission: RE | Admit: 2019-06-18 | Discharge: 2019-06-18 | Disposition: A | Discharge status: Home / Self Care | Payer: Medicare Other | Source: Ambulatory Visit | Attending: Internal Medicine | Admitting: Internal Medicine

## 2019-06-18 ENCOUNTER — Other Ambulatory Visit: Payer: Self-pay

## 2019-06-18 DIAGNOSIS — M810 Age-related osteoporosis without current pathological fracture: Secondary | ICD-10-CM

## 2019-06-18 DIAGNOSIS — Z780 Asymptomatic menopausal state: Secondary | ICD-10-CM | POA: Diagnosis not present

## 2019-06-18 DIAGNOSIS — M8589 Other specified disorders of bone density and structure, multiple sites: Secondary | ICD-10-CM | POA: Diagnosis not present

## 2019-06-21 DIAGNOSIS — E785 Hyperlipidemia, unspecified: Secondary | ICD-10-CM | POA: Diagnosis not present

## 2019-06-25 DIAGNOSIS — Z23 Encounter for immunization: Secondary | ICD-10-CM | POA: Diagnosis not present

## 2019-06-25 DIAGNOSIS — E785 Hyperlipidemia, unspecified: Secondary | ICD-10-CM | POA: Diagnosis not present

## 2019-06-25 DIAGNOSIS — L989 Disorder of the skin and subcutaneous tissue, unspecified: Secondary | ICD-10-CM | POA: Diagnosis not present

## 2019-06-25 DIAGNOSIS — M85852 Other specified disorders of bone density and structure, left thigh: Secondary | ICD-10-CM | POA: Diagnosis not present

## 2019-07-25 NOTE — Progress Notes (Signed)
73 y.o. G0P0000 Single Caucasian female here for annual exam.    Patient having hot flashes at night. Increased hot flashes during the day also.   Patient concerned about weight--particularly in midsection of abdomen.  Started medication for cholesterol.   PCP: Leeroy Cha, MD    Patient's last menstrual period was 10/04/1996.           Sexually active:  No.  The current method of family planning is post menopausal status.    Exercising: Yes.    walking, zumba and some planks Smoker:  no  Health Maintenance: Pap: 04-05-18 Neg, 07-04-15 Neg:Neg HR HPV History of abnormal Pap:  Yes, 1998 Hx cryotherapy to cervix MMG: 03-31-19 3D/Neg/density C/Birads1 Colonoscopy: 03/2018 normal;next due 2024 BMD: 06-18-19  Result :Osteopenia.  Muscle and bone pain with alendronate. TDaP: 09-21-16 Gardasil:   n/a GDJ:MEQAST Hep C:unsure Screening Labs:  PCP. Flu vaccine:  Completed.    reports that she quit smoking about 51 years ago. She has never used smokeless tobacco. She reports current alcohol use of about 5.0 - 7.0 standard drinks of alcohol per week. She reports that she does not use drugs.  Past Medical History:  Diagnosis Date  . Abnormal Pap smear   . Elevated prolactin level   . Hematuria   . Hypercholesteremia     Past Surgical History:  Procedure Laterality Date  . APPENDECTOMY    . BREAST BIOPSY Left 2018  . CHOLECYSTECTOMY     age 61  . GYNECOLOGIC CRYOSURGERY    . NASAL HEMORRHAGE CONTROL  2012  . STRABISMUS SURGERY     muscle correction  ou eyes  . TUBAL LIGATION  1993   BTL  . VULVAR LESION REMOVAL  1998   biopsy    Current Outpatient Medications  Medication Sig Dispense Refill  . Cholecalciferol (VITAMIN D3) 1000 units CAPS Take 1 capsule by mouth daily.    Javier Docker Oil 500 MG CAPS Take 1 capsule by mouth daily.    . Multiple Vitamin (MULTIVITAMIN) capsule Take 1 capsule by mouth daily.    . Probiotic Product (ALIGN PO) Take 1 tablet by mouth daily.     . simvastatin (ZOCOR) 20 MG tablet Take 20 mg by mouth at bedtime.     No current facility-administered medications for this visit.     Family History  Problem Relation Age of Onset  . Breast cancer Mother 34       recurrance at 46  . Pancreatic cancer Mother 75  . Colon cancer Father 51  . Breast cancer Sister 50       ER+ breast cancer, IDC/DCIS  . Lupus Sister     Review of Systems  Constitutional: Positive for unexpected weight change (increased girth).  All other systems reviewed and are negative.   Exam:   BP 112/76   Pulse 84   Temp 97.6 F (36.4 C) (Temporal)   Resp 16   Ht 5' 3.5" (1.613 m)   Wt 146 lb 3.2 oz (66.3 kg)   LMP 10/04/1996   BMI 25.49 kg/m     General appearance: alert, cooperative and appears stated age Head: normocephalic, without obvious abnormality, atraumatic Neck: no adenopathy, supple, symmetrical, trachea midline and thyroid normal to inspection and palpation Lungs: clear to auscultation bilaterally Breasts: normal appearance, no masses or tenderness, No nipple retraction or dimpling, No nipple discharge or bleeding, No axillary adenopathy Heart: regular rate and rhythm Abdomen: soft, non-tender; no masses, no organomegaly Extremities: extremities normal, atraumatic,  no cyanosis or edema Skin: skin color, texture, turgor normal. No rashes or lesions Lymph nodes: cervical, supraclavicular, and axillary nodes normal. Neurologic: grossly normal  Pelvic: External genitalia:  no lesions              No abnormal inguinal nodes palpated.              Urethra:  normal appearing urethra with no masses, tenderness or lesions              Bartholins and Skenes: normal                 Vagina: atrophy noted.               Cervix: no lesions              Pap taken: No. Bimanual Exam:  Uterus:  normal size, contour, position, consistency, mobility, non-tender              Adnexa: no mass, fullness, tenderness              Rectal exam: Yes.  .   Confirms.              Anus:  normal sphincter tone, no lesions  Chaperone was present for exam.  Assessment:   Well woman visit with normal exam. Hx cryo to cervix.  Vaginal atrophy.  Osteopenia.PCP managing.  Off Fosamax.  FH breast cancer - mother and sister.  They are BRCA negative. Patient declined BRCA testing after having genetic counseling.  Increased lifetime risk of breast cancer - 21- 40%  Plan: Mammogram screening discussed. Self breast awareness reviewed. Pap and HR HPV as above. Guidelines for Calcium, Vitamin D, regular exercise program including cardiovascular and weight bearing exercise. I made a referral to the high risk breast cancer screening clinic.  BMD next year.  Labs with PCP.  Follow up annually and prn.   After visit summary provided.

## 2019-07-26 ENCOUNTER — Encounter: Payer: Self-pay | Admitting: Obstetrics and Gynecology

## 2019-07-26 ENCOUNTER — Ambulatory Visit (INDEPENDENT_AMBULATORY_CARE_PROVIDER_SITE_OTHER): Payer: Medicare Other | Admitting: Obstetrics and Gynecology

## 2019-07-26 ENCOUNTER — Encounter

## 2019-07-26 ENCOUNTER — Other Ambulatory Visit: Payer: Self-pay

## 2019-07-26 VITALS — BP 112/76 | HR 84 | Temp 97.6°F | Resp 16 | Ht 63.5 in | Wt 146.2 lb

## 2019-07-26 DIAGNOSIS — Z01419 Encounter for gynecological examination (general) (routine) without abnormal findings: Secondary | ICD-10-CM

## 2019-07-26 DIAGNOSIS — Z803 Family history of malignant neoplasm of breast: Secondary | ICD-10-CM

## 2019-07-26 NOTE — Patient Instructions (Signed)

## 2019-07-30 ENCOUNTER — Telehealth: Payer: Self-pay | Admitting: Oncology

## 2019-07-30 NOTE — Telephone Encounter (Signed)
A new high risk appt has been scheduled for Maria Vaughn to see Dr. Jana Hakim on 11/30 at 4pm. Pt aware to arrive 15 minutes early.

## 2019-08-21 DIAGNOSIS — E785 Hyperlipidemia, unspecified: Secondary | ICD-10-CM | POA: Diagnosis not present

## 2019-09-01 NOTE — Progress Notes (Signed)
[place]  [phone] [phone]     ID: Maria Vaughn DOB: 10/28/1945  MR#: [id number]  QRF#:[id number]  Patient Care Team: Leeroy Cha, MD as PCP - General (Internal Medicine) Nunzio Cobbs, MD as Consulting Physician (Obstetrics and Gynecology) Jenna Ardoin, Virgie Dad, MD as Consulting Physician (Oncology) Chauncey Cruel, MD OTHER MD:  CHIEF COMPLAINT: Breast cancer high risk  CURRENT TREATMENT: Observation   HISTORY OF CURRENT ILLNESS: Maria Vaughn has a family history of breast cancer in her mother and sister. Her mother was diagnosed at age 82 and suffered a recurrence at age 29. Her sister was diagnosed at age 28 with estrogen receptor positive invasive ductal carcinoma. They are both BRCA negative. Maria Vaughn herself has not been tested for BRCA.  She also has a history of benign left biopsy. Performed on 10/13/2016, biopsy ([id number]) showed fibrocystic changes.  Other concerns include nulliparity, long time use of hormone replacement, and breast density category C.  The patient's subsequent history is as detailed below.   INTERVAL HISTORY: Nil was evaluated in the high risk breast cancer clinic on 09/03/2019.  Her most recent mammogram was performed on 03/31/2019 and showed: breast density category C; no evidence of malignancy in either breast.  Of note, she underwent bone density screening on 06/18/2019,. This showed a T-score of -2.1, which is considered osteopenic.    REVIEW OF SYSTEMS: The patient denies unusual headaches, visual changes, nausea, vomiting, stiff neck, dizziness, or gait imbalance. There has been no cough, phlegm production, or pleurisy, no chest pain or pressure, and no change in bowel or bladder habits. The patient denies fever, rash, bleeding, unexplained fatigue or unexplained weight loss.  Prior to the pandemic she exercises at the gym several times a week.  A detailed review of  systems was otherwise entirely negative.   PAST MEDICAL HISTORY: Past Medical History:  Diagnosis Date   Abnormal Pap smear    Elevated prolactin level    Hematuria    Hypercholesteremia     PAST SURGICAL HISTORY: Past Surgical History:  Procedure Laterality Date   APPENDECTOMY     BREAST BIOPSY Left 2018   CHOLECYSTECTOMY     age 50   GYNECOLOGIC CRYOSURGERY     NASAL HEMORRHAGE CONTROL  2012   STRABISMUS SURGERY     muscle correction  ou eyes   TUBAL LIGATION  1993   BTL   VULVAR LESION REMOVAL  1998   biopsy    FAMILY HISTORY: Family History  Problem Relation Age of Onset   Breast cancer Mother 52       recurrance at 51   Pancreatic cancer Mother 64   Colon cancer Father 37   Breast cancer Sister 42       ER+ breast cancer, IDC/DCIS   Lupus Sister    Patient's father was 25 years old when he died from what may have been: Or esophageal cancer.  He had only one sibling, a sister who died in infancy.  His parents did not have cancer.. Patient's mother died from pancreatic cancer at age 97.  She had also had bilateral breast cancers, status post mastectomies at 28 and 74 years of age.  The patient's mother had 1 brother who died in his 62s from cancer but the patient does not know what type. The patient has no brothers, 1 sister, with a history of breast cancer age 19, as well as a history of lupus.  Both the patient's  mother and sister have been tested for the BRCA genes and they do not carry a mutation   GYNECOLOGIC HISTORY:  Patient's last menstrual period was 10/04/1996. Menarche: 73 years old GX P 0 Contraceptive many years, with no complications HRT more than 10 years, with no complications  Hysterectomy? no BSO? no   SOCIAL HISTORY: (updated 08/2019)  [name] is currently retired from working as a Engineer, maintenance (IT) in Secondary school teacher.  She is single, lives by herself, with no pets.  She attends an [employer] DIRECTIVES:    HEALTH  MAINTENANCE: Social History   Tobacco Use   Smoking status: Former Smoker    Quit date: 05/28/1968    Years since quitting: 51.3   Smokeless tobacco: Never Used  Substance Use Topics   Alcohol use: Yes    Alcohol/week: 5.0 - 7.0 standard drinks    Types: 5 - 7 Glasses of wine per week    Comment: 1-2 glasses of wine or alcohol a day   Drug use: No     Colonoscopy: 03/2018, repeat due 2024 Michail Sermon).  PAP: 04/2018, negative  Bone density: 06/2019, osteopenia   Allergies  Allergen Reactions   Ivy Leaf [Hedera Helix]     Current Outpatient Medications  Medication Sig Dispense Refill   Cholecalciferol (VITAMIN D3) 1000 units CAPS Take 1 capsule by mouth daily.     Krill Oil 500 MG CAPS Take 1 capsule by mouth daily.     Multiple Vitamin (MULTIVITAMIN) capsule Take 1 capsule by mouth daily.     Probiotic Product (ALIGN PO) Take 1 tablet by mouth daily.     simvastatin (ZOCOR) 20 MG tablet Take 20 mg by mouth at bedtime.     No current facility-administered medications for this visit.     OBJECTIVE: Middle-aged white woman in no acute distress  Vitals:   09/03/19 1550  BP: (!) 147/82  Pulse: 96  Resp: 18  Temp: 97.6 F (36.4 C)  SpO2: 99%     Body mass index is 25.91 kg/m.   Wt Readings from Last 3 Encounters:  09/03/19 148 lb 9.6 oz (67.4 kg)  07/26/19 146 lb 3.2 oz (66.3 kg)  04/05/18 141 lb (64 kg)   Vitals:   09/03/19 1550  Weight: 148 lb 9.6 oz (67.4 kg)  Height: 5' 3.5" (1.613 m)      ECOG FS:1 - Symptomatic but completely ambulatory  Ocular: Sclerae unicteric, pupils round and equal Ear-nose-throat: Wearing a mask Lymphatic: No cervical or supraclavicular adenopathy Lungs no rales or rhonchi Heart regular rate and rhythm Abd soft, nontender, positive bowel sounds MSK no focal spinal tenderness, no joint edema Neuro: non-focal, well-oriented, appropriate affect Breasts: I do not palpate any abnormalities.  There are no skin or nipple changes  of concern.  Both axillae are benign.   LAB RESULTS:  CMP     Component Value Date/Time   NA 141 07/01/2010 0747   K 4.2 07/01/2010 0747   CL 104 07/01/2010 0747   GLUCOSE 110 (H) 07/01/2010 0747   BUN 17 07/01/2010 0747   CREATININE 0.6 07/01/2010 0747    No results found for: TOTALPROTELP, ALBUMINELP, A1GS, A2GS, BETS, BETA2SER, GAMS, MSPIKE, SPEI  No results found for: KPAFRELGTCHN, LAMBDASER, KAPLAMBRATIO  Lab Results  Component Value Date   HGB 12.2 07/01/2010   HCT 36.0 07/01/2010    No results found for: LABCA2  No components found for: XIPJAS505  No results for input(s): INR in the last 168  hours.  No results found for: LABCA2  No results found for: GHW299  No results found for: BZJ696  No results found for: VEL381  No results found for: CA2729  No components found for: HGQUANT  No results found for: CEA1 / No results found for: CEA1   No results found for: AFPTUMOR  No results found for: CHROMOGRNA  No results found for: PSA1  No visits with results within 3 Day(s) from this visit.  Latest known visit with results is:  Office Visit on 04/05/2018  Component Date Value Ref Range Status   Adequacy 04/05/2018 Satisfactory for evaluation  endocervical/transformation zone component PRESENT.   Final   Diagnosis 04/05/2018 NEGATIVE FOR INTRAEPITHELIAL LESIONS OR MALIGNANCY.   Final   Material Submitted 04/05/2018 CervicoVaginal Pap [ThinPrep Imaged]   Final   CYTOLOGY - PAP 04/05/2018 PAP RESULT   Final-Edited    (this displays the last labs from the last 3 days)  No results found for: TOTALPROTELP, ALBUMINELP, A1GS, A2GS, BETS, BETA2SER, GAMS, MSPIKE, SPEI (this displays SPEP labs)  No results found for: KPAFRELGTCHN, LAMBDASER, KAPLAMBRATIO (kappa/lambda light chains)  No results found for: HGBA, HGBA2QUANT, HGBFQUANT, HGBSQUAN (Hemoglobinopathy evaluation)   No results found for: LDH  No results found for: IRON, TIBC, IRONPCTSAT (Iron  and TIBC)  No results found for: FERRITIN  Urinalysis No results found for: COLORURINE, APPEARANCEUR, LABSPEC, PHURINE, GLUCOSEU, HGBUR, BILIRUBINUR, KETONESUR, PROTEINUR, UROBILINOGEN, NITRITE, LEUKOCYTESUR   STUDIES:  ELIGIBLE FOR AVAILABLE RESEARCH PROTOCOL: no  ASSESSMENT: 73 y.o. [place], [place] woman at increased risk for breast cancer (lifetime risk per [employer] is 18%, versus 3% for age-matched control)  (1) considering intensified screening ("fast" MRI yearly in addition to mammography)  (2) considering risk reduction with antiestrogens  PLAN: I spent approximately 60 minutes face to face with Keyosha with more than 50% of that time spent in counseling and coordination of care. Specifically we reviewed the biology of the patient's diagnosis and the specifics of her situation.  She understands that breast cells normally are estrogen dependent and that most breast cancers also depend on estrogen.  One way to reduce breast cancer risk then is to take antiestrogen pills for 5 years.  We discussed the difference between tamoxifen and anastrozole in detail. She understands that anastrozole and the aromatase inhibitors in general work by blocking estrogen production. Accordingly vaginal dryness, decrease in bone density, and of course hot flashes can result. The aromatase inhibitors can also negatively affect the cholesterol profile, although that is a minor effect. One out of 5 women on aromatase inhibitors we will feel "old and achy". This arthralgia/myalgia syndrome, which resembles fibromyalgia clinically, does resolve with stopping the medications. Accordingly this is not a reason to not try an aromatase inhibitor but it is a frequent reason to stop it (in other words 20% of women will not be able to tolerate these medications).  Tamoxifen on the other hand does not block estrogen production. It does not "take away a woman's estrogen". It blocks the estrogen receptor in breast cells. Like  anastrozole, it can also cause hot flashes. As opposed to anastrozole, tamoxifen has many estrogen-like effects. It is technically an estrogen receptor modulator. This means that in some tissues tamoxifen works like estrogen-- for example it helps strengthen the bones. It tends to improve the cholesterol profile. It can cause thickening of the endometrial lining, and even endometrial polyps or rarely cancer of the uterus.(The risk of uterine cancer due to tamoxifen is one additional cancer per  thousand women year). It can cause vaginal wetness or stickiness. It can cause blood clots through this estrogen-like effect--the risk of blood clots with tamoxifen is exactly the same as with birth control pills or hormone replacement.  Neither of these agents causes mood changes or weight gain, despite the popular belief that they can have these side effects. We have data from studies comparing either of these drugs with placebo, and in those cases the control group had the same amount of weight gain and depression as the group that took the drug.  We also discussed intensified screening and with a lifetime risk of between 15 and 20% she does not qualify for the standard MRI but she does for the abbreviated MRI which unfortunately is not yet approved by insurance.  It is however much less expensive.  The issue then is not cost but false positives and we discussed that in detail.  I also discussed the fact that she is very close to osteoporosis.  She gave Fosamax to try and could not tolerate it.  It may be equally difficult for her to try Reclast or Prolia since the main issue was bone aches and that can occur with any of the bone modifying agents  At this point she was not able to make a definitive decision but she has all the information on hand.  She understands my recommendation was very low-dose tamoxifen at 5 mg daily and consideration of fast MRI yearly.  If she decides to implement either of those she will  give [place] a call.  Otherwise I have not made a return appointment for her here but will be glad to see her again at any point in the future if and when the need arises.   Chauncey Cruel, MD   09/03/2019 4:21 PM Medical Oncology and Hematology [facility] [place], [place] [address] Tel. [phone]    Fax. [phone]   This document serves as a record of services personally performed by Lurline Del, MD. It was created on his behalf by Wilburn Mylar, a trained medical scribe. The creation of this record is based on the scribe's personal observations and the provider's statements to them.   I, Lurline Del MD, have reviewed the above documentation for accuracy and completeness, and I agree with the above.

## 2019-09-03 ENCOUNTER — Other Ambulatory Visit: Payer: Self-pay

## 2019-09-03 ENCOUNTER — Inpatient Hospital Stay: Payer: Medicare Other | Attending: Oncology | Admitting: Oncology

## 2019-09-03 DIAGNOSIS — Z1239 Encounter for other screening for malignant neoplasm of breast: Secondary | ICD-10-CM | POA: Insufficient documentation

## 2019-09-03 DIAGNOSIS — M8580 Other specified disorders of bone density and structure, unspecified site: Secondary | ICD-10-CM | POA: Insufficient documentation

## 2019-09-03 DIAGNOSIS — Z853 Personal history of malignant neoplasm of breast: Secondary | ICD-10-CM | POA: Insufficient documentation

## 2019-09-03 DIAGNOSIS — Z9189 Other specified personal risk factors, not elsewhere classified: Secondary | ICD-10-CM | POA: Insufficient documentation

## 2019-10-26 DIAGNOSIS — L298 Other pruritus: Secondary | ICD-10-CM | POA: Diagnosis not present

## 2019-10-26 DIAGNOSIS — L538 Other specified erythematous conditions: Secondary | ICD-10-CM | POA: Diagnosis not present

## 2019-10-26 DIAGNOSIS — L918 Other hypertrophic disorders of the skin: Secondary | ICD-10-CM | POA: Diagnosis not present

## 2019-10-26 DIAGNOSIS — R208 Other disturbances of skin sensation: Secondary | ICD-10-CM | POA: Diagnosis not present

## 2019-10-26 DIAGNOSIS — L821 Other seborrheic keratosis: Secondary | ICD-10-CM | POA: Diagnosis not present

## 2019-10-26 DIAGNOSIS — D2261 Melanocytic nevi of right upper limb, including shoulder: Secondary | ICD-10-CM | POA: Diagnosis not present

## 2019-10-26 DIAGNOSIS — Z7189 Other specified counseling: Secondary | ICD-10-CM | POA: Diagnosis not present

## 2019-10-26 DIAGNOSIS — M67441 Ganglion, right hand: Secondary | ICD-10-CM | POA: Diagnosis not present

## 2019-10-26 DIAGNOSIS — L814 Other melanin hyperpigmentation: Secondary | ICD-10-CM | POA: Diagnosis not present

## 2020-02-07 DIAGNOSIS — H25813 Combined forms of age-related cataract, bilateral: Secondary | ICD-10-CM | POA: Diagnosis not present

## 2020-02-07 DIAGNOSIS — H53002 Unspecified amblyopia, left eye: Secondary | ICD-10-CM | POA: Diagnosis not present

## 2020-02-07 DIAGNOSIS — H04123 Dry eye syndrome of bilateral lacrimal glands: Secondary | ICD-10-CM | POA: Diagnosis not present

## 2020-03-04 DIAGNOSIS — Z6824 Body mass index (BMI) 24.0-24.9, adult: Secondary | ICD-10-CM | POA: Diagnosis not present

## 2020-03-04 DIAGNOSIS — M25542 Pain in joints of left hand: Secondary | ICD-10-CM | POA: Diagnosis not present

## 2020-03-04 DIAGNOSIS — M79642 Pain in left hand: Secondary | ICD-10-CM | POA: Diagnosis not present

## 2020-03-04 DIAGNOSIS — E7849 Other hyperlipidemia: Secondary | ICD-10-CM | POA: Diagnosis not present

## 2020-03-06 DIAGNOSIS — S62515A Nondisplaced fracture of proximal phalanx of left thumb, initial encounter for closed fracture: Secondary | ICD-10-CM | POA: Diagnosis not present

## 2020-05-09 DIAGNOSIS — Z049 Encounter for examination and observation for unspecified reason: Secondary | ICD-10-CM | POA: Diagnosis not present

## 2020-06-25 ENCOUNTER — Other Ambulatory Visit: Payer: Self-pay | Admitting: Internal Medicine

## 2020-06-25 DIAGNOSIS — Z1231 Encounter for screening mammogram for malignant neoplasm of breast: Secondary | ICD-10-CM

## 2020-07-08 ENCOUNTER — Other Ambulatory Visit: Payer: Self-pay

## 2020-07-08 ENCOUNTER — Ambulatory Visit
Admission: RE | Admit: 2020-07-08 | Discharge: 2020-07-08 | Disposition: A | Discharge status: Home / Self Care | Payer: Medicare Other | Source: Ambulatory Visit

## 2020-07-08 DIAGNOSIS — Z1231 Encounter for screening mammogram for malignant neoplasm of breast: Secondary | ICD-10-CM

## 2020-07-11 ENCOUNTER — Other Ambulatory Visit: Payer: Self-pay | Admitting: Internal Medicine

## 2020-07-11 DIAGNOSIS — R928 Other abnormal and inconclusive findings on diagnostic imaging of breast: Secondary | ICD-10-CM

## 2020-07-15 ENCOUNTER — Other Ambulatory Visit: Payer: Self-pay | Admitting: Oncology

## 2020-07-15 DIAGNOSIS — R928 Other abnormal and inconclusive findings on diagnostic imaging of breast: Secondary | ICD-10-CM

## 2020-07-16 ENCOUNTER — Ambulatory Visit: Payer: Medicare Other

## 2020-07-16 ENCOUNTER — Other Ambulatory Visit: Payer: Self-pay

## 2020-07-16 ENCOUNTER — Other Ambulatory Visit: Payer: Self-pay | Admitting: Obstetrics and Gynecology

## 2020-07-16 ENCOUNTER — Ambulatory Visit
Admission: RE | Admit: 2020-07-16 | Discharge: 2020-07-16 | Disposition: A | Discharge status: Home / Self Care | Payer: Medicare Other | Source: Ambulatory Visit | Attending: Internal Medicine | Admitting: Internal Medicine

## 2020-07-16 DIAGNOSIS — R928 Other abnormal and inconclusive findings on diagnostic imaging of breast: Secondary | ICD-10-CM

## 2020-07-16 DIAGNOSIS — R922 Inconclusive mammogram: Secondary | ICD-10-CM | POA: Diagnosis not present

## 2020-07-29 ENCOUNTER — Ambulatory Visit: Payer: Medicare Other | Admitting: Obstetrics and Gynecology

## 2020-08-23 DIAGNOSIS — Z23 Encounter for immunization: Secondary | ICD-10-CM | POA: Diagnosis not present

## 2020-09-30 ENCOUNTER — Ambulatory Visit: Payer: Medicare Other | Admitting: Nurse Practitioner

## 2020-09-30 NOTE — Progress Notes (Signed)
74 y.o. G0P0000 Single White or Caucasian female here for breast & pelvic, but also wants to address problem: 1. Skin lesion near breast 2. Vaginal vs UTI burning     Wants testing for UTI, she was experiencing a burning sensation on the skin when she urinated. The burning started 2 weeks ago stopped after drinking water, now completed subsided. Also has dryness at time, not sexually active. Denies bleeding.  Patient's last menstrual period was 10/04/1996.          Sexually active: No.  The current method of family planning is tubal ligation.    Exercising: Yes.    some Smoker:  no  Health Maintenance: Pap:  04-05-2018 neg History of abnormal Pap:  yes MMG:  07-08-2020 bilateral & rt breast on 07-16-2020 category c density birads 1:neg Colonoscopy: 6/19 normal f/u 2024 BMD:   06-18-2019 osteopenia TDaP:  2017 Gardasil:   n/a Covid-19: moderna Hep C testing: unsure Screening Labs: poct urine-neg   reports that she has never smoked. She has never used smokeless tobacco. She reports current alcohol use of about 5.0 - 7.0 standard drinks of alcohol per week. She reports that she does not use drugs.  Past Medical History:  Diagnosis Date  . Abnormal Pap smear   . Elevated prolactin level   . Hematuria   . Hypercholesteremia     Past Surgical History:  Procedure Laterality Date  . APPENDECTOMY    . BREAST BIOPSY Left 2018  . CHOLECYSTECTOMY     age 24  . GYNECOLOGIC CRYOSURGERY    . NASAL HEMORRHAGE CONTROL  2012  . STRABISMUS SURGERY     muscle correction  ou eyes  . TUBAL LIGATION  1993   BTL  . VULVAR LESION REMOVAL  1998   biopsy    Current Outpatient Medications  Medication Sig Dispense Refill  . Cholecalciferol (VITAMIN D3) 1000 units CAPS Take 1 capsule by mouth daily.    Boris Lown Oil 500 MG CAPS Take 1 capsule by mouth daily.    . Multiple Vitamin (MULTIVITAMIN) capsule Take 1 capsule by mouth daily.    . Probiotic Product (ALIGN PO) Take 1 tablet by mouth daily.     . simvastatin (ZOCOR) 20 MG tablet Take 20 mg by mouth at bedtime.     No current facility-administered medications for this visit.    Family History  Problem Relation Age of Onset  . Breast cancer Mother 76       recurrance at 24  . Pancreatic cancer Mother 64  . Colon cancer Father 56  . Breast cancer Sister 80       ER+ breast cancer, IDC/DCIS  . Lupus Sister     Review of Systems  Constitutional: Negative.   HENT: Negative.   Eyes: Negative.   Respiratory: Negative.   Cardiovascular: Negative.   Gastrointestinal: Negative.   Endocrine: Negative.   Genitourinary:       Dark urine  Musculoskeletal: Negative.   Skin:       Pimples between breast  Allergic/Immunologic: Negative.   Neurological: Negative.   Psychiatric/Behavioral: Negative.     Exam:   BP 114/70   Pulse 74   Resp 16   Ht 5' 3.75" (1.619 m)   Wt 147 lb (66.7 kg)   LMP 10/04/1996   BMI 25.43 kg/m   Height: 5' 3.75" (161.9 cm)  General appearance: alert, cooperative and appears stated age, no acute distress  Breasts: normal appearance, no masses or tenderness  Skin: Small annular dry patch of skin left breast. Appears to have fungal qualities, in addition two papules noted mid chest between breasts, appear to have similar appearance to papules on arms. Non pigmented, no vasular  Lymph nodes: Cervical, supraclavicular, and axillary nodes normal. No abnormal inguinal nodes palpated Neurologic: Grossly normal   Pelvic: External genitalia:  no lesions              Urethra:  normal appearing urethra for age with no masses, tenderness or lesions              Bartholins and Skenes: normal                 Vagina: atrophic, yellow discharge              Cervix: neg cervical motion tenderness, no visible lesions             Bimanual Exam:   Uterus:  normal size, contour, position, consistency, mobility, non-tender              Adnexa: no mass, fullness, tenderness              Rectal: no palpable  mass   Lovena Le, CMA Chaperone was present for exam.  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

## 2020-10-01 ENCOUNTER — Ambulatory Visit (INDEPENDENT_AMBULATORY_CARE_PROVIDER_SITE_OTHER): Payer: Medicare Other | Admitting: Nurse Practitioner

## 2020-10-01 ENCOUNTER — Encounter: Payer: Self-pay | Admitting: Nurse Practitioner

## 2020-10-01 ENCOUNTER — Other Ambulatory Visit: Payer: Self-pay

## 2020-10-01 VITALS — BP 114/70 | HR 74 | Resp 16 | Ht 63.75 in | Wt 147.0 lb

## 2020-10-01 DIAGNOSIS — N952 Postmenopausal atrophic vaginitis: Secondary | ICD-10-CM | POA: Diagnosis not present

## 2020-10-01 DIAGNOSIS — N390 Urinary tract infection, site not specified: Secondary | ICD-10-CM | POA: Diagnosis not present

## 2020-10-01 DIAGNOSIS — R300 Dysuria: Secondary | ICD-10-CM

## 2020-10-01 DIAGNOSIS — B354 Tinea corporis: Secondary | ICD-10-CM

## 2020-10-01 LAB — POCT URINALYSIS DIPSTICK
Bilirubin, UA: NEGATIVE
Blood, UA: NEGATIVE
Glucose, UA: NEGATIVE
Ketones, UA: NEGATIVE
Leukocytes, UA: NEGATIVE
Nitrite, UA: NEGATIVE
Protein, UA: NEGATIVE
Urobilinogen, UA: NEGATIVE E.U./dL — AB
pH, UA: 5 (ref 5.0–8.0)

## 2020-10-01 NOTE — Patient Instructions (Addendum)
Vaginal Dryness: May use Olive oil (internally or externally) Coconut oil (internally or externally) -some women make the oil into little balls, freeze them and make a vaginal suppository that can be used a couple times a week. There are commercial products such as Replens or Good Clean Love   Fungal infection of the skin: May use clotrimazole (over the counter) Or  shampoo called Nizoral (ketoconazole)  Body ringworm is an infection of the skin that often causes a ring-shaped rash. Body ringworm is also called tinea corporis. Body ringworm can affect any part of your skin. This condition is easily spread from person to person (is very contagious). What are the causes? This condition is caused by fungi called dermatophytes. The condition develops when these fungi grow out of control on the skin. You can get this condition if you touch a person or animal that has it. You can also get it if you share any items with an infected person or pet. These include:  Clothing, bedding, and towels.  Brushes or combs.  Gym equipment.  Any other object that has the fungus on it. What increases the risk? You are more likely to develop this condition if you:  Play sports that involve close physical contact, such as wrestling.  Sweat a lot.  Live in areas that are hot and humid.  Use public showers.  Have a weakened immune system. What are the signs or symptoms? Symptoms of this condition include:  Itchy, raised red spots and bumps.  Red scaly patches.  A ring-shaped rash. The rash may have: ? A clear center. ? Scales or red bumps at its center. ? Redness near its borders. ? Dry and scaly skin on or around it. How is this diagnosed? This condition can usually be diagnosed with a skin exam. A skin scraping may be taken from the affected area and examined under a microscope to see if the fungus is present. How is this treated? This condition may be treated with:  An antifungal cream  or ointment.  An antifungal shampoo.  Antifungal medicines. These may be prescribed if your ringworm: ? Is severe. ? Keeps coming back. ? Lasts a long time. Follow these instructions at home:  Take over-the-counter and prescription medicines only as told by your health care provider.  If you were given an antifungal cream or ointment: ? Use it as told by your health care provider. ? Wash the infected area and dry it completely before applying the cream or ointment.  If you were given an antifungal shampoo: ? Use it as told by your health care provider. ? Leave the shampoo on your body for 3-5 minutes before rinsing.  While you have a rash: ? Wear loose clothing to stop clothes from rubbing and irritating it. ? Wash or change your bed sheets every night. ? Disinfect or throw out items that may be infected. ? Wash clothes and bed sheets in hot water. ? Wash your hands often with soap and water. If soap and water are not available, use hand sanitizer.  If your pet has the same infection, take your pet to see a veterinarian for treatment. How is this prevented?  Take a bath or shower every day and after every time you work out or play sports.  Dry your skin completely after bathing.  Wear sandals or shoes in public places and showers.  Change your clothes every day.  Wash athletic clothes after each use.  Do not share personal items with others.  Avoid touching red patches of skin on other people.  Avoid touching pets that have bald spots.  If you touch an animal that has a bald spot, wash your hands. Contact a health care provider if:  Your rash continues to spread after 7 days of treatment.  Your rash is not gone in 4 weeks.  The area around your rash gets red, warm, tender, and swollen. Summary  Body ringworm is an infection of the skin that often causes a ring-shaped rash.  This condition is easily spread from person to person (is very contagious).  This  condition may be treated with antifungal cream or ointment, antifungal shampoo, or antifungal medicines.  Take over-the-counter and prescription medicines only as told by your health care provider. This information is not intended to replace advice given to you by your health care provider. Make sure you discuss any questions you have with your health care provider. Document Revised: 05/19/2018 Document Reviewed: 05/19/2018 Elsevier Patient Education  2020 [employer].   Health Maintenance for Postmenopausal Women Menopause is a normal process in which your ability to get pregnant comes to an end. This process happens slowly over many months or years, usually between the ages of 58 and 29. Menopause is complete when you have missed your menstrual periods for 12 months. It is important to talk with your health care provider about some of the most common conditions that affect women after menopause (postmenopausal women). These include heart disease, cancer, and bone loss (osteoporosis). Adopting a healthy lifestyle and getting preventive care can help to promote your health and wellness. The actions you take can also lower your chances of developing some of these common conditions. What should I know about menopause? During menopause, you may get a number of symptoms, such as:  Hot flashes. These can be moderate or severe.  Night sweats.  Decrease in sex drive.  Mood swings.  Headaches.  Tiredness.  Irritability.  Memory problems.  Insomnia. Choosing to treat or not to treat these symptoms is a decision that you make with your health care provider. Do I need hormone replacement therapy?  Hormone replacement therapy is effective in treating symptoms that are caused by menopause, such as hot flashes and night sweats.  Hormone replacement carries certain risks, especially as you become older. If you are thinking about using estrogen or estrogen with progestin, discuss the benefits  and risks with your health care provider. What is my risk for heart disease and stroke? The risk of heart disease, heart attack, and stroke increases as you age. One of the causes may be a change in the body's hormones during menopause. This can affect how your body uses dietary fats, triglycerides, and cholesterol. Heart attack and stroke are medical emergencies. There are many things that you can do to help prevent heart disease and stroke. Watch your blood pressure  High blood pressure causes heart disease and increases the risk of stroke. This is more likely to develop in people who have high blood pressure readings, are of African descent, or are overweight.  Have your blood pressure checked: ? Every 3-5 years if you are 2-42 years of age. ? Every year if you are 57 years old or older. Eat a healthy diet   Eat a diet that includes plenty of vegetables, fruits, low-fat dairy products, and lean protein.  Do not eat a lot of foods that are high in solid fats, added sugars, or sodium. Get regular exercise Get regular exercise. This is  one of the most important things you can do for your health. Most adults should:  Try to exercise for at least 150 minutes each week. The exercise should increase your heart rate and make you sweat (moderate-intensity exercise).  Try to do strengthening exercises at least twice each week. Do these in addition to the moderate-intensity exercise.  Spend less time sitting. Even light physical activity can be beneficial. Other tips  Work with your health care provider to achieve or maintain a healthy weight.  Do not use any products that contain nicotine or tobacco, such as cigarettes, e-cigarettes, and chewing tobacco. If you need help quitting, ask your health care provider.  Know your numbers. Ask your health care provider to check your cholesterol and your blood sugar (glucose). Continue to have your blood tested as directed by your health care  provider. Do I need screening for cancer? Depending on your health history and family history, you may need to have cancer screening at different stages of your life. This may include screening for:  Breast cancer.  Cervical cancer.  Lung cancer.  Colorectal cancer. What is my risk for osteoporosis? After menopause, you may be at increased risk for osteoporosis. Osteoporosis is a condition in which bone destruction happens more quickly than new bone creation. To help prevent osteoporosis or the bone fractures that can happen because of osteoporosis, you may take the following actions:  If you are 48-55 years old, get at least 1,000 mg of calcium and at least 600 mg of vitamin D per day.  If you are older than age 40 but younger than age 37, get at least 1,200 mg of calcium and at least 600 mg of vitamin D per day.  If you are older than age 44, get at least 1,200 mg of calcium and at least 800 mg of vitamin D per day. Smoking and drinking excessive alcohol increase the risk of osteoporosis. Eat foods that are rich in calcium and vitamin D, and do weight-bearing exercises several times each week as directed by your health care provider. How does menopause affect my mental health? Depression may occur at any age, but it is more common as you become older. Common symptoms of depression include:  Low or sad mood.  Changes in sleep patterns.  Changes in appetite or eating patterns.  Feeling an overall lack of motivation or enjoyment of activities that you previously enjoyed.  Frequent crying spells. Talk with your health care provider if you think that you are experiencing depression. General instructions See your health care provider for regular wellness exams and vaccines. This may include:  Scheduling regular health, dental, and eye exams.  Getting and maintaining your vaccines. These include: ? Influenza vaccine. Get this vaccine each year before the flu season  begins. ? Pneumonia vaccine. ? Shingles vaccine. ? Tetanus, diphtheria, and pertussis (Tdap) booster vaccine. Your health care provider may also recommend other immunizations. Tell your health care provider if you have ever been abused or do not feel safe at home. Summary  Menopause is a normal process in which your ability to get pregnant comes to an end.  This condition causes hot flashes, night sweats, decreased interest in sex, mood swings, headaches, or lack of sleep.  Treatment for this condition may include hormone replacement therapy.  Take actions to keep yourself healthy, including exercising regularly, eating a healthy diet, watching your weight, and checking your blood pressure and blood sugar levels.  Get screened for cancer and depression. Make  sure that you are up to date with all your vaccines. This information is not intended to replace advice given to you by your health care provider. Make sure you discuss any questions you have with your health care provider. Document Revised: 09/13/2018 Document Reviewed: 09/13/2018 Elsevier Patient Education  2020 [employer].

## 2021-06-22 ENCOUNTER — Other Ambulatory Visit: Payer: Self-pay | Admitting: Internal Medicine

## 2021-06-22 DIAGNOSIS — Z1231 Encounter for screening mammogram for malignant neoplasm of breast: Secondary | ICD-10-CM

## 2021-06-25 ENCOUNTER — Other Ambulatory Visit: Payer: Self-pay | Admitting: Internal Medicine

## 2021-06-25 DIAGNOSIS — M85859 Other specified disorders of bone density and structure, unspecified thigh: Secondary | ICD-10-CM

## 2021-07-01 ENCOUNTER — Ambulatory Visit
Admission: RE | Admit: 2021-07-01 | Discharge: 2021-07-01 | Disposition: A | Discharge status: Home / Self Care | Payer: Medicare Other | Source: Ambulatory Visit | Attending: Internal Medicine | Admitting: Internal Medicine

## 2021-07-01 ENCOUNTER — Other Ambulatory Visit: Payer: Self-pay

## 2021-07-01 DIAGNOSIS — M85859 Other specified disorders of bone density and structure, unspecified thigh: Secondary | ICD-10-CM

## 2021-07-09 ENCOUNTER — Ambulatory Visit
Admission: RE | Admit: 2021-07-09 | Discharge: 2021-07-09 | Disposition: A | Discharge status: Home / Self Care | Payer: Medicare Other | Source: Ambulatory Visit | Attending: Internal Medicine | Admitting: Internal Medicine

## 2021-07-09 ENCOUNTER — Other Ambulatory Visit: Payer: Self-pay

## 2021-07-09 DIAGNOSIS — Z1231 Encounter for screening mammogram for malignant neoplasm of breast: Secondary | ICD-10-CM

## 2021-07-15 ENCOUNTER — Other Ambulatory Visit: Payer: Self-pay | Admitting: Internal Medicine

## 2021-07-15 DIAGNOSIS — R928 Other abnormal and inconclusive findings on diagnostic imaging of breast: Secondary | ICD-10-CM

## 2021-07-21 ENCOUNTER — Ambulatory Visit: Payer: Medicare Other

## 2021-07-21 ENCOUNTER — Other Ambulatory Visit: Payer: Self-pay

## 2021-07-21 ENCOUNTER — Ambulatory Visit
Admission: RE | Admit: 2021-07-21 | Discharge: 2021-07-21 | Disposition: A | Discharge status: Home / Self Care | Payer: Medicare Other | Source: Ambulatory Visit | Attending: Internal Medicine | Admitting: Internal Medicine

## 2021-07-21 DIAGNOSIS — R928 Other abnormal and inconclusive findings on diagnostic imaging of breast: Secondary | ICD-10-CM

## 2021-08-07 ENCOUNTER — Other Ambulatory Visit: Payer: Medicare Other

## 2022-06-22 ENCOUNTER — Other Ambulatory Visit: Payer: Self-pay | Admitting: Family Medicine

## 2022-06-22 DIAGNOSIS — Z1231 Encounter for screening mammogram for malignant neoplasm of breast: Secondary | ICD-10-CM

## 2022-07-22 ENCOUNTER — Ambulatory Visit
Admission: RE | Admit: 2022-07-22 | Discharge: 2022-07-22 | Disposition: A | Discharge status: Home / Self Care | Payer: Medicare Other | Source: Ambulatory Visit | Attending: Family Medicine | Admitting: Family Medicine

## 2022-07-22 DIAGNOSIS — Z1231 Encounter for screening mammogram for malignant neoplasm of breast: Secondary | ICD-10-CM

## 2023-07-02 ENCOUNTER — Other Ambulatory Visit: Payer: Self-pay | Admitting: Internal Medicine

## 2023-07-02 DIAGNOSIS — M810 Age-related osteoporosis without current pathological fracture: Secondary | ICD-10-CM

## 2023-07-04 ENCOUNTER — Other Ambulatory Visit: Payer: Self-pay | Admitting: Internal Medicine

## 2023-07-04 DIAGNOSIS — Z1231 Encounter for screening mammogram for malignant neoplasm of breast: Secondary | ICD-10-CM

## 2023-07-19 ENCOUNTER — Ambulatory Visit
Admission: RE | Admit: 2023-07-19 | Discharge: 2023-07-19 | Disposition: A | Discharge status: Home / Self Care | Payer: Medicare Other | Source: Ambulatory Visit | Attending: Internal Medicine | Admitting: Internal Medicine

## 2023-07-19 DIAGNOSIS — M810 Age-related osteoporosis without current pathological fracture: Secondary | ICD-10-CM

## 2023-08-30 ENCOUNTER — Ambulatory Visit
Admission: RE | Admit: 2023-08-30 | Discharge: 2023-08-30 | Disposition: A | Discharge status: Home / Self Care | Payer: Medicare Other | Source: Ambulatory Visit | Attending: Internal Medicine | Admitting: Internal Medicine

## 2023-08-30 DIAGNOSIS — Z1231 Encounter for screening mammogram for malignant neoplasm of breast: Secondary | ICD-10-CM

## 2024-01-25 ENCOUNTER — Other Ambulatory Visit: Payer: Medicare Other

## 2024-04-20 ENCOUNTER — Encounter: Payer: Self-pay | Admitting: Advanced Practice Midwife
